# Patient Record
Sex: Female | Born: 1970 | Race: White | Hispanic: No | Marital: Married | Smoking: Current every day smoker
Health system: Southern US, Community
[De-identification: ages and names within clinical notes are randomized; demographics above are authoritative.]

## PROBLEM LIST (undated history)

## (undated) DIAGNOSIS — M797 Fibromyalgia: Secondary | ICD-10-CM

## (undated) DIAGNOSIS — C801 Malignant (primary) neoplasm, unspecified: Secondary | ICD-10-CM

## (undated) DIAGNOSIS — M1990 Unspecified osteoarthritis, unspecified site: Secondary | ICD-10-CM

## (undated) DIAGNOSIS — E119 Type 2 diabetes mellitus without complications: Secondary | ICD-10-CM

## (undated) HISTORY — PX: CHOLECYSTECTOMY: SHX55

## (undated) HISTORY — PX: ABDOMINAL HYSTERECTOMY: SHX81

## (undated) HISTORY — PX: CYSTOSCOPY W/ URETERAL STENT PLACEMENT: SHX1429

## (undated) HISTORY — PX: TUBAL LIGATION: SHX77

---

## 2004-06-07 ENCOUNTER — Emergency Department: Payer: Self-pay | Admitting: Emergency Medicine

## 2004-07-08 ENCOUNTER — Emergency Department: Payer: Self-pay | Admitting: Emergency Medicine

## 2004-08-11 ENCOUNTER — Emergency Department: Payer: Self-pay | Admitting: Emergency Medicine

## 2004-09-17 ENCOUNTER — Inpatient Hospital Stay: Payer: Self-pay | Admitting: Internal Medicine

## 2004-09-29 ENCOUNTER — Ambulatory Visit: Payer: Self-pay | Admitting: *Deleted

## 2006-09-16 ENCOUNTER — Emergency Department: Payer: Self-pay | Admitting: Emergency Medicine

## 2007-04-01 ENCOUNTER — Emergency Department: Payer: Self-pay

## 2007-07-26 ENCOUNTER — Emergency Department: Payer: Self-pay | Admitting: Emergency Medicine

## 2007-07-27 ENCOUNTER — Emergency Department: Payer: Self-pay | Admitting: Emergency Medicine

## 2007-07-31 ENCOUNTER — Emergency Department: Payer: Self-pay | Admitting: Emergency Medicine

## 2007-09-07 ENCOUNTER — Emergency Department: Payer: Self-pay | Admitting: Emergency Medicine

## 2007-11-17 ENCOUNTER — Emergency Department: Payer: Self-pay | Admitting: Emergency Medicine

## 2008-04-27 ENCOUNTER — Emergency Department: Payer: Self-pay | Admitting: Emergency Medicine

## 2010-06-11 ENCOUNTER — Emergency Department: Payer: Self-pay | Admitting: Emergency Medicine

## 2010-08-28 ENCOUNTER — Emergency Department: Payer: Self-pay | Admitting: Emergency Medicine

## 2011-02-03 ENCOUNTER — Emergency Department: Payer: Self-pay | Admitting: Emergency Medicine

## 2011-07-10 ENCOUNTER — Inpatient Hospital Stay: Payer: Self-pay | Admitting: Internal Medicine

## 2011-07-10 ENCOUNTER — Ambulatory Visit: Payer: Self-pay | Admitting: Urology

## 2011-07-16 ENCOUNTER — Inpatient Hospital Stay: Payer: Self-pay | Admitting: Student

## 2011-07-21 ENCOUNTER — Ambulatory Visit: Payer: Self-pay | Admitting: Urology

## 2014-12-29 NOTE — Op Note (Signed)
PATIENT NAME:  Heather, Duran MR#:  [id number] DATE OF BIRTH:  01-04-1971  DATE OF PROCEDURE:  07/10/2011  PREOPERATIVE DIAGNOSIS:  Right distal ureteral stone.  POSTOPERATIVE DIAGNOSIS:  Right distal ureteral stone.  PROCEDURE:  Cystoscopy, right retrograde pyelogram, right ureteral stent placement, 6 x 24 double J.  SURGEON:  Dr. Verdie Shire  ANESTHESIA:  General.  ESTIMATED BLOOD LOSS:  None.  FLUIDS:  Please see anesthesia record.  DRAINS:  6 x 24 double J ureteral stent.  COMPLICATIONS:  None.  CONDITION:  Awake and stable to PAC-U.  INDICATION FOR PROCEDURE:  Ms. [name] is a 44 year old female who presented to the emergency room with excruciating right flank pain radiating to her lower groin.  She also complained of nausea, vomiting, and inability to keep any food down.  The patient is also an uncontrolled diabetic and presented with sugars over 400 in the emergency room.  She had a CT scan done that showed right distal ureteral stone.  After discussing the risks, benefits, complications, and all possible outcomes of the procedure, the patient wished to proceed to the operating room for above procedure.  DESCRIPTION OF THE PROCEDURE:  After signing informed consent, the patient was brought back to the operating room  and placed in the supine position.  SCDs were placed to bilateral lower extremities.  Preoperative antibiotics were given.  Anesthesia was induced and the patient was intubated without any difficulty.  She was then placed in the lithotomy position and prepped and draped in sterile fashion.  Time-out was then performed.  A 21 French cystoscope was advanced to the bladder to view the area.  The entire bladder was visualized.  There were no abnormalities seen within the bladder.  The right ureteral orifice was identified and cannulated with a sensor guidewire.  The guidewire was able to be advanced all the way up into the renal pelvis.  This was confirmed using fluoroscopy.   An open-ended ureteral stent was then advanced over the sensor guidewire and the sensor guidewire was removed.  Using Cystografin, a right retrograde pyelogram was performed that showed a dilated right collecting system.   There was evidence of a distal ureteral stone.  Once the retrograde was performed, the sensor guidewire was then replaced into the renal pelvis.  The open-ended ureteral stent was removed and a double J 6 x 24 ureteral stent was placed over the sensor guidewire all the way up into the renal pelvis.  The sensor guidewire was removed and a coil was noted both in the renal pelvis and within the bladder.  The scope was then removed and a 16 French Foley catheter was placed to straight drain.  The patient was then awakened from anesthesia without any difficulty and transferred to the PAC-U in stable condition.  ____________________________ Margy Clarks, MD erm:bjt D: 07/23/2011 09:57:58 ET T: 07/23/2011 10:14:16 ET JOB#: [id number]  cc: Margy Clarks, MD, <Dictator> Lodema Hong [facility] MD ELECTRONICALLY SIGNED 10/27/2011 16:49

## 2018-07-13 ENCOUNTER — Emergency Department: Payer: Self-pay

## 2018-07-13 ENCOUNTER — Encounter: Payer: Self-pay | Admitting: Emergency Medicine

## 2018-07-13 ENCOUNTER — Emergency Department
Admission: EM | Admit: 2018-07-13 | Discharge: 2018-07-13 | Disposition: A | Discharge status: Home / Self Care | Payer: Self-pay | Attending: Emergency Medicine | Admitting: Emergency Medicine

## 2018-07-13 DIAGNOSIS — Y998 Other external cause status: Secondary | ICD-10-CM | POA: Insufficient documentation

## 2018-07-13 DIAGNOSIS — S46012A Strain of muscle(s) and tendon(s) of the rotator cuff of left shoulder, initial encounter: Secondary | ICD-10-CM | POA: Insufficient documentation

## 2018-07-13 DIAGNOSIS — Y92003 Bedroom of unspecified non-institutional (private) residence as the place of occurrence of the external cause: Secondary | ICD-10-CM | POA: Insufficient documentation

## 2018-07-13 DIAGNOSIS — Y93F2 Activity, caregiving, lifting: Secondary | ICD-10-CM | POA: Insufficient documentation

## 2018-07-13 DIAGNOSIS — X500XXA Overexertion from strenuous movement or load, initial encounter: Secondary | ICD-10-CM | POA: Insufficient documentation

## 2018-07-13 MED ORDER — MELOXICAM 15 MG PO TABS: 15.0000 mg | ORAL_TABLET | Freq: Every day | ORAL | 1 refills | Status: AC

## 2018-07-13 MED ORDER — TRAMADOL HCL 50 MG PO TABS: 50.0000 mg | ORAL_TABLET | Freq: Four times a day (QID) | ORAL | 0 refills | Status: AC | PRN

## 2018-07-13 NOTE — ED Triage Notes (Signed)
Pt reports that she was lifting her dad last night and felt her left shoulder pop. Pt reports pain with lifting arm.

## 2018-07-13 NOTE — ED Provider Notes (Signed)
[facility] Emergency Department Provider Note  ____________________________________________  Time seen: Approximately 4:48 PM  I have reviewed the triage vital signs and the nursing notes.   HISTORY  Chief Complaint Shoulder Pain    HPI Heather Duran is a 47 y.o. female presents to the emergency department with left shoulder pain.  Patient reports that her father recently had a stroke and she has been lifting him in and out of the bed.  Patient reports that she felt a pop and exquisite pain along the left lateral shoulder.  Patient reports that pain radiates underneath her left arm.  Patient reports that she is having difficulty brushing her hair and unclasping her bra.  Patient has never had a rotator cuff injury in the past.  No prior left shoulder issues.  Patient has been taking extra strength Tylenol but reports that medication is not helping her symptoms.  She denies chest pain or chest tightness.    History reviewed. No pertinent past medical history.  There are no active problems to display for this patient.    Prior to Admission medications   Medication Sig Start Date End Date Taking? Authorizing Provider  meloxicam (MOBIC) 15 MG tablet Take 1 tablet (15 mg total) by mouth daily for 7 days. 07/13/18 07/20/18  Lannie Fields, PA-C  traMADol (ULTRAM) 50 MG tablet Take 1 tablet (50 mg total) by mouth every 6 (six) hours as needed for up to 3 days. 07/13/18 07/16/18  Lannie Fields, PA-C    Allergies Penicillins and Sulfa antibiotics  No family history on file.  Social History Social History   Tobacco Use  . Smoking status: Not on file  Substance Use Topics  . Alcohol use: Not on file  . Drug use: Not on file     Review of Systems  Constitutional: No fever/chills Eyes: No visual changes. No discharge ENT: No upper respiratory complaints. Cardiovascular: no chest pain. Respiratory: no cough. No SOB. Gastrointestinal: No abdominal pain.   No nausea, no vomiting.  No diarrhea.  No constipation. Genitourinary: Negative for dysuria. No hematuria Musculoskeletal: Patient has left shoulder pain.  Skin: Negative for rash, abrasions, lacerations, ecchymosis. Neurological: Negative for headaches, focal weakness or numbness.   ____________________________________________   PHYSICAL EXAM:  VITAL SIGNS: ED Triage Vitals  Enc Vitals Group     BP 07/13/18 1525 138/63     Pulse Rate 07/13/18 1525 78     Resp 07/13/18 1525 17     Temp 07/13/18 1525 98.4 F (36.9 C)     Temp Source 07/13/18 1525 Oral     SpO2 07/13/18 1525 97 %     Weight 07/13/18 1521 144 lb (65.3 kg)     Height 07/13/18 1521 5\' 4"  (1.626 m)     Head Circumference --      Peak Flow --      Pain Score 07/13/18 1521 6     Pain Loc --      Pain Edu? --      Excl. in [place]? --      Constitutional: Alert and oriented. Well appearing and in no acute distress. Eyes: Conjunctivae are normal. PERRL. EOMI. Head: Atraumatic. Cardiovascular: Normal rate, regular rhythm. Normal S1 and S2.  Good peripheral circulation. Respiratory: Normal respiratory effort without tachypnea or retractions. Lungs CTAB. Good air entry to the bases with no decreased or absent breath sounds. Musculoskeletal: Patient has left-sided rotator cuff weakness with left lateral shoulder tenderness to palpation.  Patient  is able to perform abduction at left shoulder to approximately 45 degrees with some effort.  Palpable radial pulse, left. Neurologic:  Normal speech and language. No gross focal neurologic deficits are appreciated.  Skin:  Skin is warm, dry and intact. No rash noted. Psychiatric: Mood and affect are normal. Speech and behavior are normal. Patient exhibits appropriate insight and judgement.   ____________________________________________   LABS (all labs ordered are listed, but only abnormal results are displayed)  Labs Reviewed - No data to  display ____________________________________________  EKG   ____________________________________________  RADIOLOGY I personally viewed and evaluated these images as part of my medical decision making, as well as reviewing the written report by the radiologist.  Dg Shoulder Left  Result Date: 07/13/2018 CLINICAL DATA:  LEFT shoulder popped while trying to help per father, who has had a stroke, LEFT shoulder pain since EXAM: LEFT SHOULDER - 2+ VIEW COMPARISON:  None FINDINGS: Question mild osseous demineralization. AC joint alignment normal. No acute fracture, dislocation, or bone destruction. Visualized LEFT ribs unremarkable. IMPRESSION: No acute abnormalities. Electronically Signed   By: Lavonia Dana M.D.   On: 07/13/2018 15:46    ____________________________________________    PROCEDURES  Procedure(s) performed:    Procedures    Medications - No data to display   ____________________________________________   INITIAL IMPRESSION / ASSESSMENT AND PLAN / ED COURSE  Pertinent labs & imaging results that were available during my care of the patient were reviewed by me and considered in my medical decision making (see chart for details).  Review of the [place] CSRS was performed in accordance of the [place] prior to dispensing any controlled drugs.      Assessment and plan Left shoulder pain Patient presents to the emergency department with left-sided shoulder pain for the past 24 hours after lifting her father.  X-ray examination of the left shoulder revealed no acute abnormality.  Physical exam was concerning for left rotator cuff weakness.  Patient was placed in a sling and discharged with meloxicam.  She was referred to orthopedics, Dr. Rudene Christians.  Vital signs were reassuring prior to discharge.  All patient questions were answered.    ____________________________________________  FINAL CLINICAL IMPRESSION(S) / ED DIAGNOSES  Final diagnoses:  Traumatic tear of left rotator  cuff, unspecified tear extent, initial encounter      NEW MEDICATIONS STARTED DURING THIS VISIT:  ED Discharge Orders         Ordered    meloxicam (MOBIC) 15 MG tablet  Daily     07/13/18 1611    traMADol (ULTRAM) 50 MG tablet  Every 6 hours PRN     07/13/18 1611              This chart was dictated using voice recognition software/Dragon. Despite best efforts to proofread, errors can occur which can change the meaning. Any change was purely unintentional.    Lannie Fields, PA-C 07/13/18 1656    Arta Silence, MD 07/13/18 2149

## 2018-07-13 NOTE — ED Notes (Signed)
See triage note  States she felt a pop to left shoulder after lifting her father   No deformity noted   Good pulses and sensation

## 2018-09-24 ENCOUNTER — Emergency Department
Admission: EM | Admit: 2018-09-24 | Discharge: 2018-09-24 | Disposition: A | Discharge status: Home / Self Care | Payer: Self-pay | Attending: Emergency Medicine | Admitting: Emergency Medicine

## 2018-09-24 ENCOUNTER — Other Ambulatory Visit: Payer: Self-pay

## 2018-09-24 DIAGNOSIS — F17200 Nicotine dependence, unspecified, uncomplicated: Secondary | ICD-10-CM | POA: Insufficient documentation

## 2018-09-24 DIAGNOSIS — N390 Urinary tract infection, site not specified: Secondary | ICD-10-CM | POA: Insufficient documentation

## 2018-09-24 LAB — COMPREHENSIVE METABOLIC PANEL
ALBUMIN: 3.8 g/dL (ref 3.5–5.0)
ALT: 17 U/L (ref 0–44)
AST: 15 U/L (ref 15–41)
Alkaline Phosphatase: 75 U/L (ref 38–126)
Anion gap: 6 (ref 5–15)
BILIRUBIN TOTAL: 0.6 mg/dL (ref 0.3–1.2)
BUN: 12 mg/dL (ref 6–20)
CHLORIDE: 104 mmol/L (ref 98–111)
CO2: 26 mmol/L (ref 22–32)
Calcium: 8.9 mg/dL (ref 8.9–10.3)
Creatinine, Ser: 0.55 mg/dL (ref 0.44–1.00)
GFR calc Af Amer: >60 mL/min (ref >60)
GFR calc non Af Amer: >60 mL/min (ref >60)
GLUCOSE: 301 mg/dL — AB (ref 70–99)
POTASSIUM: 3.8 mmol/L (ref 3.5–5.1)
Sodium: 136 mmol/L (ref 135–145)
Total Protein: 7 g/dL (ref 6.5–8.1)

## 2018-09-24 LAB — URINALYSIS, COMPLETE (UACMP) WITH MICROSCOPIC
BACTERIA UA: NONE SEEN
Bilirubin Urine: NEGATIVE
Glucose, UA: >=500 mg/dL — AB
Hgb urine dipstick: NEGATIVE
Ketones, ur: NEGATIVE mg/dL
Nitrite: NEGATIVE
Protein, ur: 100 mg/dL — AB
SQUAMOUS EPITHELIAL / LPF: NONE SEEN (ref 0–5)
Specific Gravity, Urine: 1.007 (ref 1.005–1.030)
pH: 6 (ref 5.0–8.0)

## 2018-09-24 LAB — CBC
HEMATOCRIT: 41.5 % (ref 36.0–46.0)
Hemoglobin: 13.9 g/dL (ref 12.0–15.0)
MCH: 29 pg (ref 26.0–34.0)
MCHC: 33.5 g/dL (ref 30.0–36.0)
MCV: 86.6 fL (ref 80.0–100.0)
Platelets: 259 10*3/uL (ref 150–400)
RBC: 4.79 MIL/uL (ref 3.87–5.11)
RDW: 13 % (ref 11.5–15.5)
WBC: 12.6 10*3/uL — ABNORMAL HIGH (ref 4.0–10.5)
nRBC: 0 % (ref 0.0–0.2)

## 2018-09-24 LAB — LIPASE, BLOOD: Lipase: 24 U/L (ref 11–51)

## 2018-09-24 MED ORDER — PHENAZOPYRIDINE HCL 200 MG PO TABS: 200.0000 mg | ORAL_TABLET | Freq: Three times a day (TID) | ORAL | 0 refills | Status: DC | PRN

## 2018-09-24 MED ORDER — CIPROFLOXACIN IN D5W 400 MG/200ML IV SOLN
400.0000 mg | Freq: Once | INTRAVENOUS | Status: AC
  Administered 2018-09-24: 400 mg via INTRAVENOUS
  Filled 2018-09-24: qty 200

## 2018-09-24 MED ORDER — KETOROLAC TROMETHAMINE 30 MG/ML IJ SOLN
30.0000 mg | Freq: Once | INTRAMUSCULAR | Status: AC
  Administered 2018-09-24: 30 mg via INTRAVENOUS
  Filled 2018-09-24: qty 1

## 2018-09-24 MED ORDER — SODIUM CHLORIDE 0.9 % IV BOLUS
1000.0000 mL | Freq: Once | INTRAVENOUS | Status: AC
  Administered 2018-09-24: 1000 mL via INTRAVENOUS

## 2018-09-24 MED ORDER — SODIUM CHLORIDE 0.9% FLUSH
3.0000 mL | Freq: Once | INTRAVENOUS | Status: AC
  Administered 2018-09-24: 3 mL via INTRAVENOUS

## 2018-09-24 MED ORDER — CIPROFLOXACIN HCL 500 MG PO TABS: 500.0000 mg | ORAL_TABLET | Freq: Two times a day (BID) | ORAL | 0 refills | Status: AC

## 2018-09-24 MED ORDER — PHENAZOPYRIDINE HCL 200 MG PO TABS
200.0000 mg | ORAL_TABLET | Freq: Once | ORAL | Status: AC
  Administered 2018-09-24: 200 mg via ORAL
  Filled 2018-09-24: qty 1

## 2018-09-24 NOTE — ED Provider Notes (Signed)
[facility] Emergency Department Provider Note   ____________________________________________   I have reviewed the triage vital signs and the nursing notes.   HISTORY  Chief Complaint Pelvic Pain   History limited by: Not Limited   HPI Heather Duran is a 48 y.o. female who presents to the emergency department today because of concern for left lower quadrant abdominal pain. The pain started yesterday. She was not doing any abnormal activity when it started. Since it began it has been constant with some increase and decrease in severity. She describes the pain as sharp. It is accompanied by nausea but she has not had any vomiting. She has not noticed any change in defecation. States her urine has been clear. The patient has had chills but no fevers. Has had similar pain in the past with kidney stones.    Per medical record review patient has a history of hysterectomy  History reviewed. No pertinent past medical history.  There are no active problems to display for this patient.   Past Surgical History:  Procedure Laterality Date  . ABDOMINAL HYSTERECTOMY    . CESAREAN SECTION    . CHOLECYSTECTOMY    . CYSTOSCOPY W/ URETERAL STENT PLACEMENT    . TUBAL LIGATION      Prior to Admission medications   Not on File    Allergies Penicillins and Sulfa antibiotics  History reviewed. No pertinent family history.  Social History Social History   Tobacco Use  . Smoking status: Current Every Day Smoker  Substance Use Topics  . Alcohol use: Not Currently  . Drug use: Not on file    Review of Systems Constitutional: No fever/chills Eyes: No visual changes. ENT: No sore throat. Cardiovascular: Denies chest pain. Respiratory: Denies shortness of breath. Gastrointestinal: Positive for left lower quadrant pain.  Genitourinary: Negative for dysuria. Musculoskeletal: Negative for back pain. Skin: Negative for rash. Neurological: Negative for  headaches, focal weakness or numbness.  ____________________________________________   PHYSICAL EXAM:  VITAL SIGNS: ED Triage Vitals  Enc Vitals Group     BP 09/24/18 1034 132/65     Pulse Rate 09/24/18 1034 77     Resp 09/24/18 1034 16     Temp 09/24/18 1034 97.9 F (36.6 C)     Temp Source 09/24/18 1034 Oral     SpO2 09/24/18 1034 100 %     Weight 09/24/18 1035 139 lb (63 kg)     Height 09/24/18 1035 5\' 4"  (1.626 m)     Head Circumference --      Peak Flow --      Pain Score 09/24/18 1035 7   Constitutional: Alert and oriented.  Eyes: Conjunctivae are normal.  ENT      Head: Normocephalic and atraumatic.      Nose: No congestion/rhinnorhea.      Mouth/Throat: Mucous membranes are moist.      Neck: No stridor. Hematological/Lymphatic/Immunilogical: No cervical lymphadenopathy. Cardiovascular: Normal rate, regular rhythm.  No murmurs, rubs, or gallops.  Respiratory: Normal respiratory effort without tachypnea nor retractions. Breath sounds are clear and equal bilaterally. No wheezes/rales/rhonchi. Gastrointestinal: Soft and tender to palpation in the left side of the abdomen.  Genitourinary: Deferred Musculoskeletal: Normal range of motion in all extremities. No lower extremity edema. Neurologic:  Normal speech and language. No gross focal neurologic deficits are appreciated.  Skin:  Skin is warm, dry and intact. No rash noted. Psychiatric: Mood and affect are normal. Speech and behavior are normal. Patient exhibits appropriate insight  and judgment.  ____________________________________________    LABS (pertinent positives/negatives)  Lipase 24 CMP wnl except glu 301 UA clear, > 500 glucose, protein 100, trace leukocytes, 21-50 wbc CBC wbc 12.6, hgb 13.9, plt 259 ____________________________________________   EKG  None  ____________________________________________     RADIOLOGY  None  ____________________________________________   PROCEDURES  Procedures  ____________________________________________   INITIAL IMPRESSION / ASSESSMENT AND PLAN / ED COURSE  Pertinent labs & imaging results that were available during my care of the patient were reviewed by me and considered in my medical decision making (see chart for details).   Patient presented to the emergency department today because of concerns for left lower quadrant abdominal pain.  On exam she did have some tenderness left lower quadrant and in the left CVA.  Urine was concerning for possible urinary tract infection with white blood cells and leukocytosis.  No red blood cells to suggest kidney stone.  Will plan on giving patient dose of IV antibiotics.  Will plan on discharge with further antibiotics.  Discussed findings and plan with patient.  ___________________________________________   FINAL CLINICAL IMPRESSION(S) / ED DIAGNOSES  Final diagnoses:  Lower urinary tract infectious disease     Note: This dictation was prepared with Dragon dictation. Any transcriptional errors that result from this process are unintentional     Nance Pear, MD 09/24/18 [phone]

## 2018-09-24 NOTE — ED Notes (Signed)
Gave pt urine cup waiting on sample.

## 2018-09-24 NOTE — ED Notes (Signed)
Sent rainbow to lab. 

## 2018-09-24 NOTE — Discharge Instructions (Addendum)
Please seek medical attention for any high fevers, chest pain, shortness of breath, change in behavior, persistent vomiting, bloody stool or any other new or concerning symptoms.  

## 2018-09-24 NOTE — ED Triage Notes (Signed)
Pt states L pelvic pain and nausea. Symptoms began yesterday. No ovaries. Denies urinary symptoms. A&O.

## 2018-09-25 LAB — URINE CULTURE: Culture: <10000 — AB

## 2018-10-23 ENCOUNTER — Encounter: Payer: Self-pay | Admitting: Emergency Medicine

## 2018-10-23 ENCOUNTER — Emergency Department: Payer: Self-pay

## 2018-10-23 ENCOUNTER — Emergency Department
Admission: EM | Admit: 2018-10-23 | Discharge: 2018-10-23 | Disposition: A | Discharge status: Home / Self Care | Payer: Self-pay | Attending: Emergency Medicine | Admitting: Emergency Medicine

## 2018-10-23 ENCOUNTER — Other Ambulatory Visit: Payer: Self-pay

## 2018-10-23 DIAGNOSIS — F17200 Nicotine dependence, unspecified, uncomplicated: Secondary | ICD-10-CM | POA: Insufficient documentation

## 2018-10-23 DIAGNOSIS — R739 Hyperglycemia, unspecified: Secondary | ICD-10-CM

## 2018-10-23 DIAGNOSIS — E1165 Type 2 diabetes mellitus with hyperglycemia: Secondary | ICD-10-CM | POA: Insufficient documentation

## 2018-10-23 DIAGNOSIS — B3741 Candidal cystitis and urethritis: Secondary | ICD-10-CM | POA: Insufficient documentation

## 2018-10-23 LAB — CBC WITH DIFFERENTIAL/PLATELET
ABS IMMATURE GRANULOCYTES: 0.04 10*3/uL (ref 0.00–0.07)
Basophils Absolute: 0.1 10*3/uL (ref 0.0–0.1)
Basophils Relative: 1 %
EOS ABS: 0.3 10*3/uL (ref 0.0–0.5)
Eosinophils Relative: 2 %
HEMATOCRIT: 41.2 % (ref 36.0–46.0)
HEMOGLOBIN: 13.6 g/dL (ref 12.0–15.0)
Immature Granulocytes: 0 %
Lymphocytes Relative: 23 %
Lymphs Abs: 3.1 10*3/uL (ref 0.7–4.0)
MCH: 28.5 pg (ref 26.0–34.0)
MCHC: 33 g/dL (ref 30.0–36.0)
MCV: 86.4 fL (ref 80.0–100.0)
MONO ABS: 0.6 10*3/uL (ref 0.1–1.0)
Monocytes Relative: 5 %
NRBC: 0 % (ref 0.0–0.2)
Neutro Abs: 9.4 10*3/uL — ABNORMAL HIGH (ref 1.7–7.7)
Neutrophils Relative %: 69 %
Platelets: 266 10*3/uL (ref 150–400)
RBC: 4.77 MIL/uL (ref 3.87–5.11)
RDW: 12.9 % (ref 11.5–15.5)
WBC: 13.6 10*3/uL — AB (ref 4.0–10.5)

## 2018-10-23 LAB — COMPREHENSIVE METABOLIC PANEL
ALT: 15 U/L (ref 0–44)
AST: 15 U/L (ref 15–41)
Albumin: 3.4 g/dL — ABNORMAL LOW (ref 3.5–5.0)
Alkaline Phosphatase: 76 U/L (ref 38–126)
Anion gap: 8 (ref 5–15)
BILIRUBIN TOTAL: 0.4 mg/dL (ref 0.3–1.2)
BUN: 15 mg/dL (ref 6–20)
CALCIUM: 8.9 mg/dL (ref 8.9–10.3)
CO2: 24 mmol/L (ref 22–32)
CREATININE: 0.58 mg/dL (ref 0.44–1.00)
Chloride: 102 mmol/L (ref 98–111)
GFR calc Af Amer: >60 mL/min (ref >60)
GLUCOSE: 395 mg/dL — AB (ref 70–99)
Potassium: 3.8 mmol/L (ref 3.5–5.1)
Sodium: 134 mmol/L — ABNORMAL LOW (ref 135–145)
TOTAL PROTEIN: 6.9 g/dL (ref 6.5–8.1)

## 2018-10-23 LAB — URINALYSIS, COMPLETE (UACMP) WITH MICROSCOPIC
BILIRUBIN URINE: NEGATIVE
Hgb urine dipstick: NEGATIVE
KETONES UR: NEGATIVE mg/dL
LEUKOCYTE UA: NEGATIVE
Nitrite: NEGATIVE
PH: 6 (ref 5.0–8.0)
Protein, ur: 30 mg/dL — AB
SPECIFIC GRAVITY, URINE: 1.032 — AB (ref 1.005–1.030)

## 2018-10-23 MED ORDER — SODIUM CHLORIDE 0.9 % IV BOLUS
1000.0000 mL | Freq: Once | INTRAVENOUS | Status: AC
  Administered 2018-10-23: 1000 mL via INTRAVENOUS

## 2018-10-23 MED ORDER — IOHEXOL 300 MG/ML  SOLN
100.0000 mL | Freq: Once | INTRAMUSCULAR | Status: AC | PRN
  Administered 2018-10-23: 100 mL via INTRAVENOUS
  Filled 2018-10-23: qty 100

## 2018-10-23 MED ORDER — ONDANSETRON HCL 4 MG/2ML IJ SOLN
4.0000 mg | Freq: Once | INTRAMUSCULAR | Status: AC
  Administered 2018-10-23: 4 mg via INTRAVENOUS
  Filled 2018-10-23: qty 2

## 2018-10-23 MED ORDER — DIPHENOXYLATE-ATROPINE 2.5-0.025 MG PO TABS: 2.0000 | ORAL_TABLET | Freq: Four times a day (QID) | ORAL | 0 refills | Status: DC | PRN

## 2018-10-23 MED ORDER — PIOGLITAZONE HCL 15 MG PO TABS: 15.0000 mg | ORAL_TABLET | Freq: Every day | ORAL | 0 refills | Status: AC

## 2018-10-23 MED ORDER — CLINDAMYCIN HCL 150 MG PO CAPS: 300.0000 mg | ORAL_CAPSULE | Freq: Three times a day (TID) | ORAL | 0 refills | Status: DC

## 2018-10-23 MED ORDER — FLUCONAZOLE 50 MG PO TABS
150.0000 mg | ORAL_TABLET | Freq: Once | ORAL | Status: AC
  Administered 2018-10-23: 150 mg via ORAL
  Filled 2018-10-23: qty 1

## 2018-10-23 NOTE — ED Triage Notes (Signed)
N/V/D since yesterday ,  Boil to panty line region x6 days , started oozing yesterday ( hx of simular boils to same region)

## 2018-10-23 NOTE — ED Provider Notes (Signed)
[facility] Emergency Department Provider Note  ___________________________________________   First MD Initiated Contact with Patient 10/23/18 1056     (approximate)  I have reviewed the triage vital signs and the nursing notes.   HISTORY  Chief Complaint Emesis   HPI Heather Duran is a 48 y.o. female who presents to the emergency department for treatment and evaluation of nausea and diarrhea that started yesterday.  She has also had an abscess to the left groin for the past 6 or 7 days but has started draining.  She has had these previously. She has had no relief of diarrhea with Imodium.   History reviewed. No pertinent past medical history.  There are no active problems to display for this patient.   Past Surgical History:  Procedure Laterality Date  . ABDOMINAL HYSTERECTOMY    . CESAREAN SECTION    . CHOLECYSTECTOMY    . CYSTOSCOPY W/ URETERAL STENT PLACEMENT    . TUBAL LIGATION      Prior to Admission medications   Medication Sig Start Date End Date Taking? Authorizing Provider  clindamycin (CLEOCIN) 150 MG capsule Take 2 capsules (300 mg total) by mouth 3 (three) times daily. 10/23/18   Licia Harl B, FNP  diphenoxylate-atropine (LOMOTIL) 2.5-0.025 MG tablet Take 2 tablets by mouth 4 (four) times daily as needed for diarrhea or loose stools. 10/23/18   Ilyanna Baillargeon, Johnette Abraham B, FNP  phenazopyridine (PYRIDIUM) 200 MG tablet Take 1 tablet (200 mg total) by mouth 3 (three) times daily as needed for pain. 09/24/18 09/24/19  Nance Pear, MD  pioglitazone (ACTOS) 15 MG tablet Take 1 tablet (15 mg total) by mouth daily. 10/23/18   Renny Remer, Johnette Abraham B, FNP    Allergies Penicillins and Sulfa antibiotics  No family history on file.  Social History Social History   Tobacco Use  . Smoking status: Current Every Day Smoker  . Smokeless tobacco: Never Used  Substance Use Topics  . Alcohol use: Not Currently  . Drug use: Not on file    Review of  Systems  Constitutional: No fever/chills. Eyes: No visual changes. ENT: No sore throat. Cardiovascular: Denies chest pain. Respiratory: Denies shortness of breath. Gastrointestinal: No abdominal pain.  Positive for nausea, no vomiting.  4-5 episodes of  diarrhea.  No constipation. Genitourinary: Negative for dysuria. Musculoskeletal: Negative for back pain.  Skin: Negative for rash. Neurological: Negative for headaches, focal weakness or numbness. ____________________________________________   PHYSICAL EXAM:  VITAL SIGNS: ED Triage Vitals  Enc Vitals Group     BP 10/23/18 1008 131/63     Pulse Rate 10/23/18 1008 78     Resp 10/23/18 1008 18     Temp 10/23/18 1008 98.4 F (36.9 C)     Temp Source 10/23/18 1008 Oral     SpO2 10/23/18 1008 98 %     Weight 10/23/18 1009 140 lb (63.5 kg)     Height 10/23/18 1009 5\' 4"  (1.626 m)     Head Circumference --      Peak Flow --      Pain Score 10/23/18 1009 3     Pain Loc --      Pain Edu? --      Excl. in [place]? --     Constitutional: Alert and oriented. Well appearing and in no acute distress. Eyes: Conjunctivae are normal. PERRL. EOMI. Head: Atraumatic. Nose: No congestion/rhinnorhea. Mouth/Throat: Mucous membranes are moist.  Oropharynx non-erythematous. Neck: No stridor.   Cardiovascular: Normal rate, regular rhythm.  Grossly normal heart sounds.  Good peripheral circulation. Respiratory: Normal respiratory effort.  No retractions. Lungs CTAB. Gastrointestinal: Soft, right lower quadrant tenderness on exam. No distention. No abdominal bruits. No CVA tenderness. Musculoskeletal: No lower extremity tenderness nor edema.  No joint effusions. Neurologic:  Normal speech and language. No gross focal neurologic deficits are appreciated. No gait instability. Skin:  Skin is warm, dry and intact. No rash noted. Psychiatric: Mood and affect are normal. Speech and behavior are normal.  ____________________________________________    LABS (all labs ordered are listed, but only abnormal results are displayed)  Labs Reviewed  CBC WITH DIFFERENTIAL/PLATELET - Abnormal; Notable for the following components:      Result Value   WBC 13.6 (*)    Neutro Abs 9.4 (*)    All other components within normal limits  COMPREHENSIVE METABOLIC PANEL - Abnormal; Notable for the following components:   Sodium 134 (*)    Glucose, Bld 395 (*)    Albumin 3.4 (*)    All other components within normal limits  URINALYSIS, COMPLETE (UACMP) WITH MICROSCOPIC - Abnormal; Notable for the following components:   Color, Urine YELLOW (*)    APPearance CLEAR (*)    Specific Gravity, Urine 1.032 (*)    Glucose, UA >=500 (*)    Protein, ur 30 (*)    Bacteria, UA RARE (*)    All other components within normal limits   ____________________________________________  EKG  Not indicated. ____________________________________________  RADIOLOGY  ED MD interpretation:  CT abdomen and pelvis is negative for acute findings.   Official radiology report(s): Ct Abdomen Pelvis W Contrast  Result Date: 10/23/2018 CLINICAL DATA:  Nausea, vomiting, diarrhea. EXAM: CT ABDOMEN AND PELVIS WITH CONTRAST TECHNIQUE: Multidetector CT imaging of the abdomen and pelvis was performed using the standard protocol following bolus administration of intravenous contrast. CONTRAST:  180mL OMNIPAQUE IOHEXOL 300 MG/ML  SOLN COMPARISON:  CT scan of July 15, 2011. FINDINGS: Lower chest: No acute abnormality. Hepatobiliary: No focal liver abnormality is seen. Status post cholecystectomy. No biliary dilatation. Pancreas: Unremarkable. No pancreatic ductal dilatation or surrounding inflammatory changes. Spleen: Normal in size without focal abnormality. Adrenals/Urinary Tract: Stable left adrenal adenoma. Right adrenal gland appears normal. Left renal cyst is noted. No hydronephrosis or renal obstruction is noted. Urinary bladder is unremarkable. No renal or ureteral calculi are  noted. Stomach/Bowel: Stomach is within normal limits. Appendix appears normal. No evidence of bowel wall thickening, distention, or inflammatory changes. Vascular/Lymphatic: Aortic atherosclerosis. No enlarged abdominal or pelvic lymph nodes. Reproductive: Status post hysterectomy. No adnexal masses. Other: No abdominal wall hernia or abnormality. No abdominopelvic ascites. Musculoskeletal: No acute or significant osseous findings. IMPRESSION: Stable left adrenal adenoma. No acute abnormality seen in the abdomen or pelvis. Aortic Atherosclerosis (ICD10-I70.0). Electronically Signed   By: Marijo Conception, M.D.   On: 10/23/2018 12:24    ____________________________________________   PROCEDURES  Procedure(s) performed: None  Procedures  Critical Care performed: No  ____________________________________________   INITIAL IMPRESSION / ASSESSMENT AND PLAN / ED COURSE  As part of my medical decision making, I reviewed the following data within the electronic MEDICAL RECORD NUMBER Notes from prior ED visits  The 48 year old female presenting to the emergency department for treatment and evaluation of nausea and diarrhea for the past 24 hours or so.  She also has an abscess that is draining in the left groin.  She is a type II diabetic but has not been on any of her medications for the past 10  years.  Patient states that she lost a significant amount of weight and her blood sugars were pretty well controlled so she was taken off of all of her medications.  She states that she checks her blood sugar and it typically runs between 190 and 220.  She states that if it drops below 150 that she feels bad.  We discussed her labs today and she was advised that she does need to be on medication for her diabetes.  She is reluctant to start medications again, but agrees to "try."  She states that she does not tolerate metformin.  Glipizide was also a second option, however she has a significant allergic reaction to  sulfa.  Therefore, she will be prescribed Actos and encouraged to check her blood sugar a couple times a day.  She was also advised to stay on a diabetic diet.   We also discussed the results of her CT which is negative for acute findings. The cause of her RLQ tenderness is unclear. She was advised to establish a PCP as soon as possible and schedule a follow up appointment. She reports that she won't have any insurance for the next 90 days. She was advised to return to the ER for symptoms that change, worsen, or for new concerns. She was also provided a list of community resources for lower cost health care providers and encouraged to apply for assistance.      ____________________________________________   FINAL CLINICAL IMPRESSION(S) / ED DIAGNOSES  Final diagnoses:  Hyperglycemia  Candida cystitis     ED Discharge Orders         Ordered    clindamycin (CLEOCIN) 150 MG capsule  3 times daily     10/23/18 1310    diphenoxylate-atropine (LOMOTIL) 2.5-0.025 MG tablet  4 times daily PRN     10/23/18 1310    pioglitazone (ACTOS) 15 MG tablet  Daily     10/23/18 1310           Note:  This document was prepared using Dragon voice recognition software and may include unintentional dictation errors.    Victorino Dike, FNP 10/24/18 [place], [place], MD 10/24/18 1446

## 2018-10-23 NOTE — ED Notes (Signed)
See triage note  States she developed a small possible abscess area to left groin area about 3 days ago  Also has been nauseated and had some diarrhea

## 2018-11-22 ENCOUNTER — Emergency Department
Admission: EM | Admit: 2018-11-22 | Discharge: 2018-11-22 | Disposition: A | Discharge status: Home / Self Care | Payer: Self-pay | Attending: Emergency Medicine | Admitting: Emergency Medicine

## 2018-11-22 ENCOUNTER — Other Ambulatory Visit: Payer: Self-pay

## 2018-11-22 DIAGNOSIS — R739 Hyperglycemia, unspecified: Secondary | ICD-10-CM | POA: Insufficient documentation

## 2018-11-22 DIAGNOSIS — R110 Nausea: Secondary | ICD-10-CM | POA: Insufficient documentation

## 2018-11-22 DIAGNOSIS — R197 Diarrhea, unspecified: Secondary | ICD-10-CM | POA: Insufficient documentation

## 2018-11-22 DIAGNOSIS — F17200 Nicotine dependence, unspecified, uncomplicated: Secondary | ICD-10-CM | POA: Insufficient documentation

## 2018-11-22 DIAGNOSIS — Z79899 Other long term (current) drug therapy: Secondary | ICD-10-CM | POA: Insufficient documentation

## 2018-11-22 LAB — COMPREHENSIVE METABOLIC PANEL
ALT: 18 U/L (ref 0–44)
AST: 14 U/L — AB (ref 15–41)
Albumin: 3.8 g/dL (ref 3.5–5.0)
Alkaline Phosphatase: 77 U/L (ref 38–126)
Anion gap: 9 (ref 5–15)
BUN: 18 mg/dL (ref 6–20)
CO2: 25 mmol/L (ref 22–32)
CREATININE: 0.58 mg/dL (ref 0.44–1.00)
Calcium: 8.8 mg/dL — ABNORMAL LOW (ref 8.9–10.3)
Chloride: 102 mmol/L (ref 98–111)
Glucose, Bld: 322 mg/dL — ABNORMAL HIGH (ref 70–99)
POTASSIUM: 4 mmol/L (ref 3.5–5.1)
Sodium: 136 mmol/L (ref 135–145)
TOTAL PROTEIN: 7.1 g/dL (ref 6.5–8.1)
Total Bilirubin: 0.2 mg/dL — ABNORMAL LOW (ref 0.3–1.2)

## 2018-11-22 LAB — CBC
HCT: 42 % (ref 36.0–46.0)
Hemoglobin: 13.7 g/dL (ref 12.0–15.0)
MCH: 28.7 pg (ref 26.0–34.0)
MCHC: 32.6 g/dL (ref 30.0–36.0)
MCV: 88.1 fL (ref 80.0–100.0)
Platelets: 277 10*3/uL (ref 150–400)
RBC: 4.77 MIL/uL (ref 3.87–5.11)
RDW: 13.1 % (ref 11.5–15.5)
WBC: 11.6 10*3/uL — ABNORMAL HIGH (ref 4.0–10.5)
nRBC: 0 % (ref 0.0–0.2)

## 2018-11-22 LAB — URINALYSIS, COMPLETE (UACMP) WITH MICROSCOPIC
Bacteria, UA: NONE SEEN
Bilirubin Urine: NEGATIVE
Glucose, UA: >=500 mg/dL — AB
Hgb urine dipstick: NEGATIVE
Ketones, ur: NEGATIVE mg/dL
Leukocytes,Ua: NEGATIVE
Nitrite: NEGATIVE
Protein, ur: 100 mg/dL — AB
Specific Gravity, Urine: 1.027 (ref 1.005–1.030)
pH: 6 (ref 5.0–8.0)

## 2018-11-22 LAB — POCT PREGNANCY, URINE: Preg Test, Ur: NEGATIVE

## 2018-11-22 LAB — LIPASE, BLOOD: LIPASE: 37 U/L (ref 11–51)

## 2018-11-22 LAB — GLUCOSE, CAPILLARY: GLUCOSE-CAPILLARY: 265 mg/dL — AB (ref 70–99)

## 2018-11-22 MED ORDER — ONDANSETRON HCL 4 MG/2ML IJ SOLN
4.0000 mg | Freq: Once | INTRAMUSCULAR | Status: AC
  Administered 2018-11-22: 4 mg via INTRAVENOUS
  Filled 2018-11-22: qty 2

## 2018-11-22 MED ORDER — SODIUM CHLORIDE 0.9 % IV BOLUS
1000.0000 mL | Freq: Once | INTRAVENOUS | Status: AC
  Administered 2018-11-22: 1000 mL via INTRAVENOUS

## 2018-11-22 MED ORDER — KETOROLAC TROMETHAMINE 30 MG/ML IJ SOLN
15.0000 mg | Freq: Once | INTRAMUSCULAR | Status: AC
  Administered 2018-11-22: 15 mg via INTRAVENOUS
  Filled 2018-11-22: qty 1

## 2018-11-22 MED ORDER — ONDANSETRON 4 MG PO TBDP: 4.0000 mg | ORAL_TABLET | Freq: Three times a day (TID) | ORAL | 0 refills | Status: DC | PRN

## 2018-11-22 NOTE — ED Notes (Signed)
ED Provider at bedside. 

## 2018-11-22 NOTE — ED Provider Notes (Signed)
[facility] Emergency Department Provider Note  ____________________________________________  Time seen: Approximately 11:15 AM  I have reviewed the triage vital signs and the nursing notes.   HISTORY  Chief Complaint Diarrhea   HPI Heather Duran is a 48 y.o. female with history as listed below who presents for evaluation of nausea and diarrhea.  Patient reports that her symptoms have been ongoing for 3 days.  She reports 4-6 daily episodes of watery diarrhea and constant moderate nausea.  No vomiting, no fever or chills, no cough or respiratory symptoms, no chest pain or shortness of breath, no abdominal pain.  She is still able to keep fluids down.  She denies any recent travel or any known sick contact exposures.  No recent antibiotic use or history of C. difficile  PMH None - reviewed  Past Surgical History:  Procedure Laterality Date  . ABDOMINAL HYSTERECTOMY    . CESAREAN SECTION    . CHOLECYSTECTOMY    . CYSTOSCOPY W/ URETERAL STENT PLACEMENT    . TUBAL LIGATION      Prior to Admission medications   Medication Sig Start Date End Date Taking? Authorizing Provider  clindamycin (CLEOCIN) 150 MG capsule Take 2 capsules (300 mg total) by mouth 3 (three) times daily. 10/23/18   Triplett, Cari B, FNP  diphenoxylate-atropine (LOMOTIL) 2.5-0.025 MG tablet Take 2 tablets by mouth 4 (four) times daily as needed for diarrhea or loose stools. 10/23/18   Triplett, Cari B, FNP  ondansetron (ZOFRAN ODT) 4 MG disintegrating tablet Take 1 tablet (4 mg total) by mouth every 8 (eight) hours as needed. 11/22/18   Rudene Re, MD  phenazopyridine (PYRIDIUM) 200 MG tablet Take 1 tablet (200 mg total) by mouth 3 (three) times daily as needed for pain. 09/24/18 09/24/19  Nance Pear, MD  pioglitazone (ACTOS) 15 MG tablet Take 1 tablet (15 mg total) by mouth daily. 10/23/18   Triplett, Johnette Abraham B, FNP    Allergies Penicillins and Sulfa antibiotics  No family history  on file.  Social History Social History   Tobacco Use  . Smoking status: Current Every Day Smoker  . Smokeless tobacco: Never Used  Substance Use Topics  . Alcohol use: Not Currently  . Drug use: Not on file    Review of Systems  Constitutional: Negative for fever. Eyes: Negative for visual changes. ENT: Negative for sore throat. Neck: No neck pain  Cardiovascular: Negative for chest pain. Respiratory: Negative for shortness of breath. Gastrointestinal: Negative for abdominal pain, vomiting. + nausea and diarrhea. Genitourinary: Negative for dysuria. Musculoskeletal: Negative for back pain. Skin: Negative for rash. Neurological: Negative for headaches, weakness or numbness. Psych: No SI or HI  ____________________________________________   PHYSICAL EXAM:  VITAL SIGNS: ED Triage Vitals [11/22/18 0932]  Enc Vitals Group     BP (!) 144/70     Pulse Rate 82     Resp 18     Temp 98.5 F (36.9 C)     Temp Source Oral     SpO2 99 %     Weight 143 lb (64.9 kg)     Height 5\' 4"  (1.626 m)     Head Circumference      Peak Flow      Pain Score 0     Pain Loc      Pain Edu?      Excl. in [place]?     Constitutional: Alert and oriented. Well appearing and in no apparent distress. HEENT:  Head: Normocephalic and atraumatic.         Eyes: Conjunctivae are normal. Sclera is non-icteric.       Mouth/Throat: Mucous membranes are moist.       Neck: Supple with no signs of meningismus. Cardiovascular: Regular rate and rhythm. No murmurs, gallops, or rubs. 2+ symmetrical distal pulses are present in all extremities. No JVD. Respiratory: Normal respiratory effort. Lungs are clear to auscultation bilaterally. No wheezes, crackles, or rhonchi.  Gastrointestinal: Soft, non tender, and non distended with positive bowel sounds. No rebound or guarding. Musculoskeletal: Nontender with normal range of motion in all extremities. No edema, cyanosis, or erythema of extremities.  Neurologic: Normal speech and language. Face is symmetric. Moving all extremities. No gross focal neurologic deficits are appreciated. Skin: Skin is warm, dry and intact. No rash noted. Psychiatric: Mood and affect are normal. Speech and behavior are normal.  ____________________________________________   LABS (all labs ordered are listed, but only abnormal results are displayed)  Labs Reviewed  COMPREHENSIVE METABOLIC PANEL - Abnormal; Notable for the following components:      Result Value   Glucose, Bld 322 (*)    Calcium 8.8 (*)    AST 14 (*)    Total Bilirubin 0.2 (*)    All other components within normal limits  CBC - Abnormal; Notable for the following components:   WBC 11.6 (*)    All other components within normal limits  URINALYSIS, COMPLETE (UACMP) WITH MICROSCOPIC - Abnormal; Notable for the following components:   Color, Urine YELLOW (*)    APPearance CLEAR (*)    Glucose, UA >=500 (*)    Protein, ur 100 (*)    All other components within normal limits  GLUCOSE, CAPILLARY - Abnormal; Notable for the following components:   Glucose-Capillary 265 (*)    All other components within normal limits  LIPASE, BLOOD  POC URINE PREG, ED  POCT PREGNANCY, URINE  CBG MONITORING, ED   ____________________________________________  EKG  none  ____________________________________________  RADIOLOGY  none  ____________________________________________   PROCEDURES  Procedure(s) performed: None Procedures Critical Care performed:  None ____________________________________________   INITIAL IMPRESSION / ASSESSMENT AND PLAN / ED COURSE  48 y.o. female with history as listed below who presents for evaluation of nausea and diarrhea x 3 days.  Patient is extremely well-appearing in no distress with normal vital signs, abdomen is soft with no tenderness throughout.  No signs of dehydration on clinical exam.  Labs showing mildly elevated white count of 11.6 consistent with  a viral syndrome.  Electrolytes are within normal limits.  Glucose slightly elevated which trended down after IV fluids.  Patient is tolerating p.o. and reports significant improvement of her nausea.  Will discharge home on bland diet, increase oral hydration, Zofran for nausea, follow-up with primary care doctor.  Discussed in a return precautions peer      As part of my medical decision making, I reviewed the following data within the [place] notes reviewed and incorporated, Labs reviewed , Old chart reviewed, Notes from prior ED visits and [place] Controlled Substance Database    Pertinent labs & imaging results that were available during my care of the patient were reviewed by me and considered in my medical decision making (see chart for details).    ____________________________________________   FINAL CLINICAL IMPRESSION(S) / ED DIAGNOSES  Final diagnoses:  Diarrhea of presumed infectious origin  Hyperglycemia      NEW MEDICATIONS STARTED DURING THIS VISIT:  ED Discharge Orders         Ordered    ondansetron (ZOFRAN ODT) 4 MG disintegrating tablet  Every 8 hours PRN     11/22/18 1120           Note:  This document was prepared using Dragon voice recognition software and may include unintentional dictation errors.    Alfred Levins, [place], MD 11/22/18 1120

## 2018-11-22 NOTE — ED Triage Notes (Signed)
Pt arrived via POV with diarrhea since Sunday. Pt's son is currently admitted with MRSA and he has the same symptoms. Nausea continues but no SOB, cough, or dizziness.

## 2018-11-22 NOTE — ED Notes (Addendum)
Pt states she experiencing 6X epidodes of diarrhea daily since Sunday. Reports weakness, constant nausea, denies vomiting. Denies pain at this time NAd noted at this time

## 2019-06-06 ENCOUNTER — Emergency Department
Admission: EM | Admit: 2019-06-06 | Discharge: 2019-06-06 | Disposition: A | Discharge status: Home / Self Care | Payer: Self-pay | Attending: Emergency Medicine | Admitting: Emergency Medicine

## 2019-06-06 ENCOUNTER — Emergency Department: Payer: Self-pay

## 2019-06-06 ENCOUNTER — Encounter: Payer: Self-pay | Admitting: Emergency Medicine

## 2019-06-06 ENCOUNTER — Other Ambulatory Visit: Payer: Self-pay

## 2019-06-06 DIAGNOSIS — F17210 Nicotine dependence, cigarettes, uncomplicated: Secondary | ICD-10-CM | POA: Insufficient documentation

## 2019-06-06 DIAGNOSIS — Z8543 Personal history of malignant neoplasm of ovary: Secondary | ICD-10-CM | POA: Insufficient documentation

## 2019-06-06 DIAGNOSIS — E1142 Type 2 diabetes mellitus with diabetic polyneuropathy: Secondary | ICD-10-CM | POA: Insufficient documentation

## 2019-06-06 DIAGNOSIS — Z79899 Other long term (current) drug therapy: Secondary | ICD-10-CM | POA: Insufficient documentation

## 2019-06-06 HISTORY — DX: Malignant (primary) neoplasm, unspecified: C80.1

## 2019-06-06 HISTORY — DX: Type 2 diabetes mellitus without complications: E11.9

## 2019-06-06 HISTORY — DX: Fibromyalgia: M79.7

## 2019-06-06 HISTORY — DX: Unspecified osteoarthritis, unspecified site: M19.90

## 2019-06-06 LAB — DIFFERENTIAL
Abs Immature Granulocytes: 0.05 10*3/uL (ref 0.00–0.07)
Basophils Absolute: 0.1 10*3/uL (ref 0.0–0.1)
Basophils Relative: 1 %
Eosinophils Absolute: 0.3 10*3/uL (ref 0.0–0.5)
Eosinophils Relative: 2 %
Immature Granulocytes: 0 %
Lymphocytes Relative: 32 %
Lymphs Abs: 4.1 10*3/uL — ABNORMAL HIGH (ref 0.7–4.0)
Monocytes Absolute: 0.5 10*3/uL (ref 0.1–1.0)
Monocytes Relative: 4 %
Neutro Abs: 7.7 10*3/uL (ref 1.7–7.7)
Neutrophils Relative %: 61 %
Smear Review: NORMAL

## 2019-06-06 LAB — BASIC METABOLIC PANEL
Anion gap: 8 (ref 5–15)
BUN: 23 mg/dL — ABNORMAL HIGH (ref 6–20)
CO2: 24 mmol/L (ref 22–32)
Calcium: 9.3 mg/dL (ref 8.9–10.3)
Chloride: 103 mmol/L (ref 98–111)
Creatinine, Ser: 0.85 mg/dL (ref 0.44–1.00)
GFR calc Af Amer: >60 mL/min (ref >60)
GFR calc non Af Amer: >60 mL/min (ref >60)
Glucose, Bld: 391 mg/dL — ABNORMAL HIGH (ref 70–99)
Potassium: 4.2 mmol/L (ref 3.5–5.1)
Sodium: 135 mmol/L (ref 135–145)

## 2019-06-06 LAB — CBC
HCT: 43.4 % (ref 36.0–46.0)
HCT: 42.9 % (ref 36.0–46.0)
Hemoglobin: 14.5 g/dL (ref 12.0–15.0)
Hemoglobin: 14.4 g/dL (ref 12.0–15.0)
MCH: 28.8 pg (ref 26.0–34.0)
MCH: 28.9 pg (ref 26.0–34.0)
MCHC: 33.4 g/dL (ref 30.0–36.0)
MCHC: 33.6 g/dL (ref 30.0–36.0)
MCV: 86.3 fL (ref 80.0–100.0)
MCV: 86 fL (ref 80.0–100.0)
Platelets: 255 10*3/uL (ref 150–400)
Platelets: 265 10*3/uL (ref 150–400)
RBC: 5.03 MIL/uL (ref 3.87–5.11)
RBC: 4.99 MIL/uL (ref 3.87–5.11)
RDW: 13 % (ref 11.5–15.5)
RDW: 13.1 % (ref 11.5–15.5)
WBC: 12.4 10*3/uL — ABNORMAL HIGH (ref 4.0–10.5)
WBC: 12.7 10*3/uL — ABNORMAL HIGH (ref 4.0–10.5)
nRBC: 0 % (ref 0.0–0.2)
nRBC: 0 % (ref 0.0–0.2)

## 2019-06-06 LAB — SEDIMENTATION RATE: Sed Rate: 20 mm/hr (ref 0–20)

## 2019-06-06 LAB — GLUCOSE, CAPILLARY
Glucose-Capillary: 406 mg/dL — ABNORMAL HIGH (ref 70–99)
Glucose-Capillary: 252 mg/dL — ABNORMAL HIGH (ref 70–99)

## 2019-06-06 IMAGING — DX DG FOOT COMPLETE 3+V*L*
3 series · 3 of 3 positions shown · non-contrast
Comparison: None.

CLINICAL DATA: Burning foot pain

EXAM:
LEFT FOOT - COMPLETE 3+ VIEW; RIGHT FOOT COMPLETE - 3+ VIEW

[foot ap]
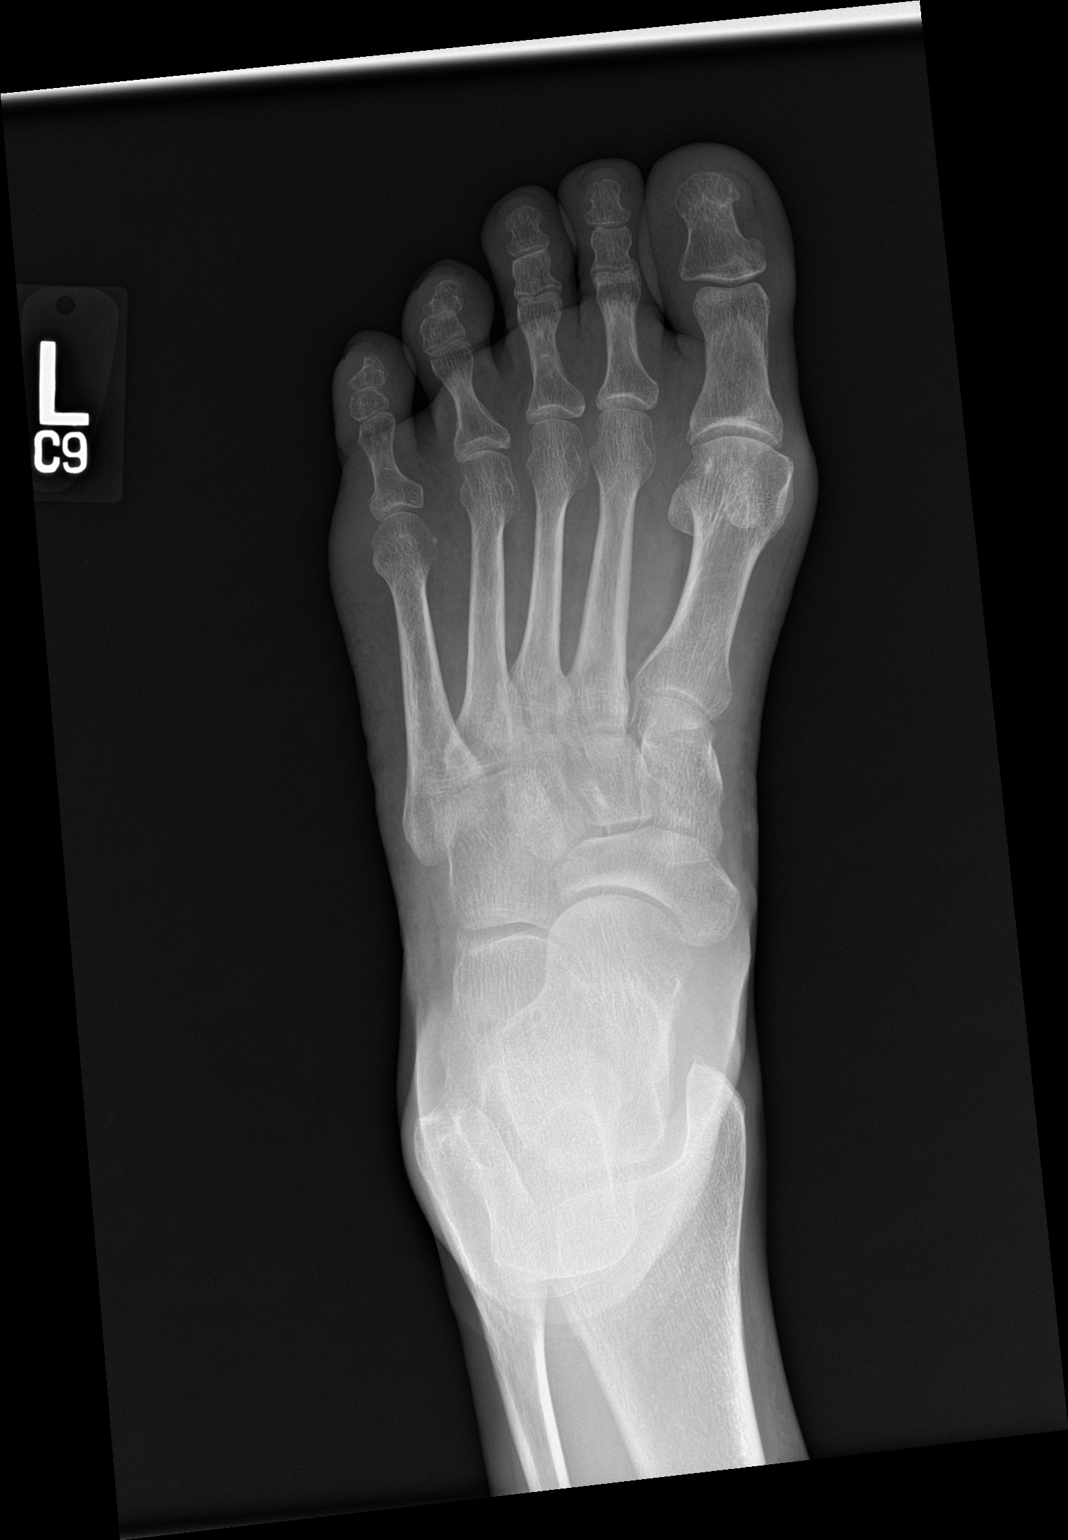

[foot obl]
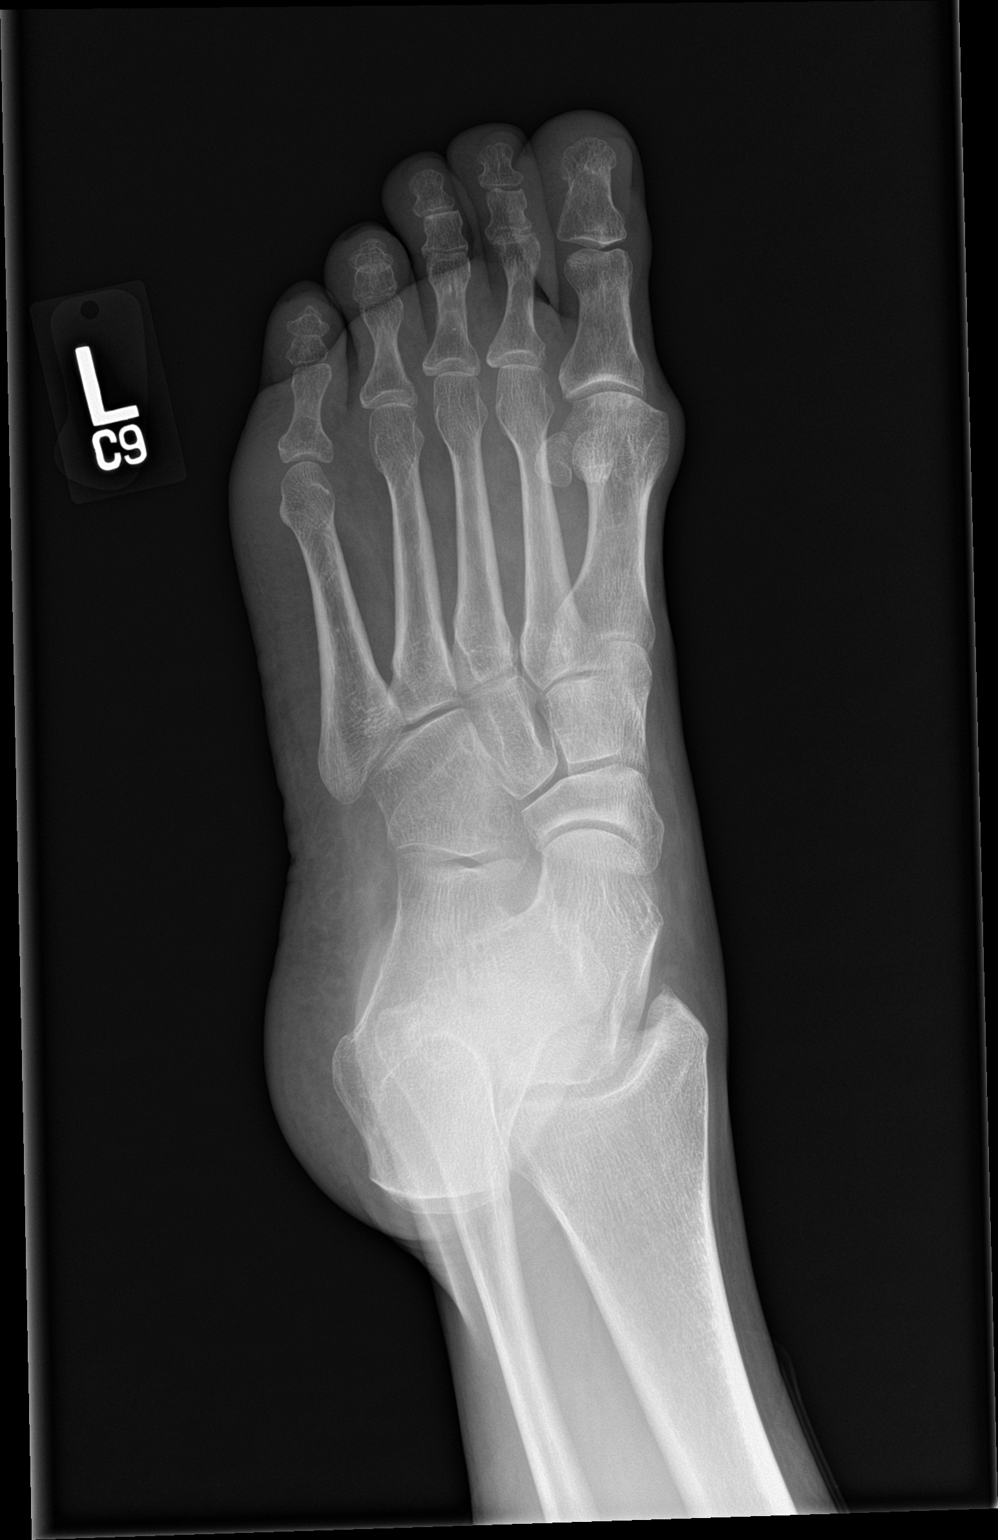

[foot lat]
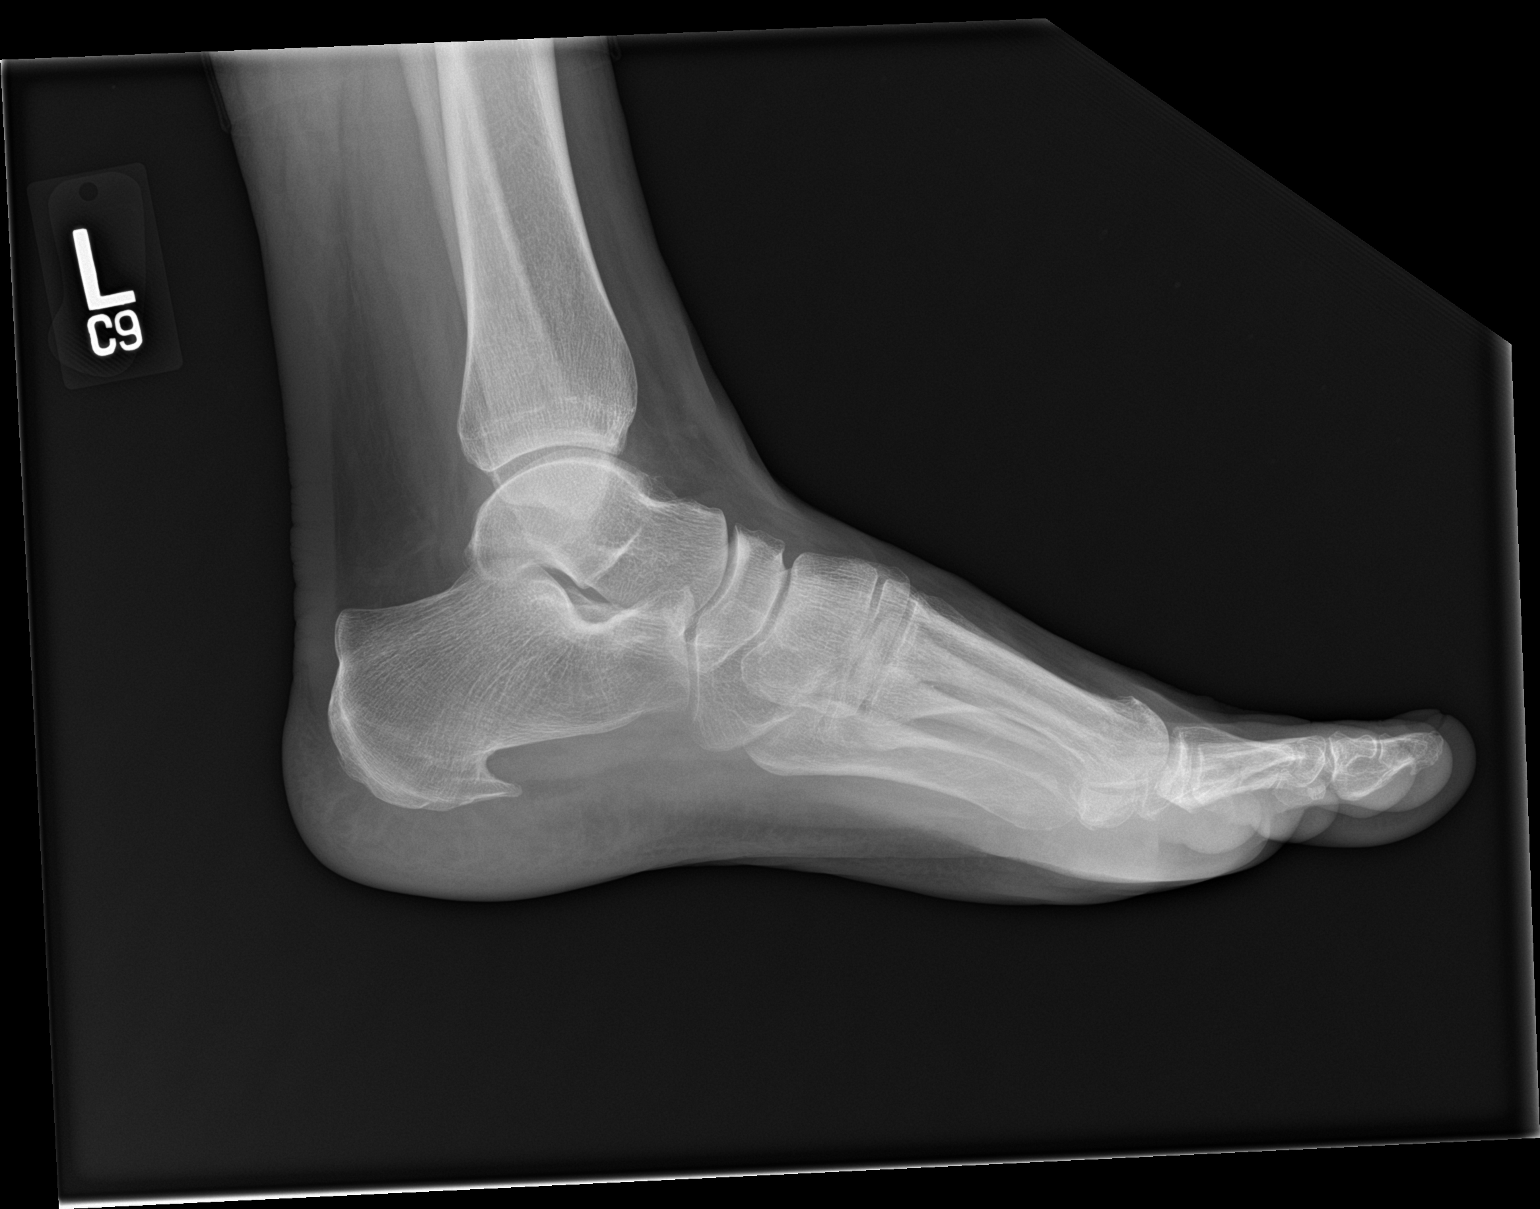

[3 of 3 positions shown; findings below may reference images not displayed]

FINDINGS: No fracture or dislocation of the bilateral feet. Joint spaces are
well preserved. Large bilateral plantar calcaneal spurs. Soft
tissues are unremarkable.
IMPRESSION: 1. No fracture or dislocation of the bilateral feet. Joint spaces
are preserved. Soft tissues are unremarkable.

2. Large bilateral plantar calcaneal spurs, which can be
symptomatic.

## 2019-06-06 MED ORDER — GABAPENTIN 100 MG PO CAPS: 100.0000 mg | ORAL_CAPSULE | Freq: Three times a day (TID) | ORAL | 2 refills | Status: AC

## 2019-06-06 MED ORDER — SODIUM CHLORIDE 0.9 % IV BOLUS
1000.0000 mL | Freq: Once | INTRAVENOUS | Status: AC
  Administered 2019-06-06: 1000 mL via INTRAVENOUS

## 2019-06-06 MED ORDER — SODIUM CHLORIDE 0.9 % IV BOLUS
1000.0000 mL | Freq: Once | INTRAVENOUS | Status: AC
  Administered 2019-06-06: 14:00:00 1000 mL via INTRAVENOUS

## 2019-06-06 NOTE — ED Triage Notes (Signed)
Patient reports burning pain that started in her right foot 4 days ago. Report over the last two days, the pain has moved to her left foot. Also reports some swelling in that foot. Patient reports history of diabetes and states she has had high blood sugar for the past 2 day. Denies known injury to feet.

## 2019-06-06 NOTE — ED Provider Notes (Signed)
[facility] Emergency Department Provider Note   ____________________________________________   First MD Initiated Contact with Patient 06/06/19 1329     (approximate)  I have reviewed the triage vital signs and the nursing notes.   HISTORY  Chief Complaint Foot Pain and Hyperglycemia    HPI Heather Duran is a 48 y.o. female who has been diabetic since age 48.  She reports diabetes started as gestational diabetes and discontinued.  She lost over 300 pounds and was able to get off of all her medicines.  She does have insulin 70/30 to use for isolated episodes of high blood sugar.  She ate a sub-from [employer] yesterday and her sugar went up to 500.  Still high 300s to low 400s now.  She reports she got developed the burning pain in her right foot about 4 days ago and then it moved to both feet over the last 2 days.  Feet seem to be a little bit swollen as well.  She denies any other complaints of infection specifically no dysuria no coughing no known fever although here she does have a temperature of 99.  She reports the soles of her feet feel swollen and when she walks it does not seem like her toes are touching the ground.  The pain is worse with touch or pressure.        Past Medical History:  Diagnosis Date  . Arthritis   . 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 Diabetes mellitus without complication ([place])   . Fibromyalgia     There are no active problems to display for this patient.   Past Surgical History:  Procedure Laterality Date  . ABDOMINAL HYSTERECTOMY    . CESAREAN SECTION    . CHOLECYSTECTOMY    . CYSTOSCOPY W/ URETERAL STENT PLACEMENT    . TUBAL LIGATION      Prior to Admission medications   Medication Sig Start Date End Date Taking? Authorizing Provider  clindamycin (CLEOCIN) 150 MG capsule Take 2 capsules (300 mg total) by mouth 3 (three) times daily. 10/23/18   Triplett, Cari B, FNP  diphenoxylate-atropine (LOMOTIL) 2.5-0.025  MG tablet Take 2 tablets by mouth 4 (four) times daily as needed for diarrhea or loose stools. 10/23/18   Triplett, Cari B, FNP  ondansetron (ZOFRAN ODT) 4 MG disintegrating tablet Take 1 tablet (4 mg total) by mouth every 8 (eight) hours as needed. 11/22/18   Rudene Re, MD  phenazopyridine (PYRIDIUM) 200 MG tablet Take 1 tablet (200 mg total) by mouth 3 (three) times daily as needed for pain. 09/24/18 09/24/19  Nance Pear, MD  pioglitazone (ACTOS) 15 MG tablet Take 1 tablet (15 mg total) by mouth daily. 10/23/18   Triplett, Johnette Abraham B, FNP    Allergies Penicillins and Sulfa antibiotics  No family history on file.  Social History Social History   Tobacco Use  . Smoking status: Current Every Day Smoker    Packs/day: 0.50  . Smokeless tobacco: Never Used  Substance Use Topics  . Alcohol use: Not Currently  . Drug use: Not Currently    Review of Systems  Constitutional: No fever/chills Eyes: No visual changes. ENT: No sore throat. Cardiovascular: Denies chest pain. Respiratory: Denies shortness of breath. Gastrointestinal: No abdominal pain.  No nausea, no vomiting.  No diarrhea.  No constipation. Genitourinary: Negative for dysuria. Musculoskeletal: Negative for back pain. Skin: Negative for rash. Neurological: Negative for headaches, focal weakness    ____________________________________________  PHYSICAL EXAM:  VITAL SIGNS: ED Triage Vitals  Enc Vitals Group     BP 06/06/19 1138 (!) 152/61     Pulse Rate 06/06/19 1138 87     Resp 06/06/19 1138 16     Temp 06/06/19 1138 99.1 F (37.3 C)     Temp Source 06/06/19 1138 Oral     SpO2 06/06/19 1138 98 %     Weight 06/06/19 1145 140 lb (63.5 kg)     Height 06/06/19 1145 5\' 7"  (1.702 m)     Head Circumference --      [place] Flow --      Pain Score 06/06/19 1145 7     Pain Loc --      Pain Edu? --      Excl. in [place]? --     Constitutional: Alert and oriented. Well appearing and in no acute distress. Eyes:  Conjunctivae are normal.  Head: Atraumatic. Nose: No congestion/rhinnorhea. Mouth/Throat: Mucous membranes are moist.  Oropharynx non-erythematous. Neck: No stridor.   Cardiovascular: Normal rate, regular rhythm. Grossly normal heart sounds.  Good peripheral circulation. Respiratory: Normal respiratory effort.  No retractions. Lungs CTAB. Gastrointestinal: Soft and nontender. No distention. No abdominal bruits. No CVA tenderness. Musculoskeletal: No lower extremity tenderness nor edema.  Normal posterior tibial pulses Neurologic:  Normal speech and language. No gross focal neurologic deficits are appreciated except for the bilateral foot numbness which progresses up to the ankles.  There is no rash or swelling that I can see in the feet.  Toes are warm and pink.  Good capillary refill. Skin:  Skin is warm, dry and intact. No rash noted.   ____________________________________________   LABS (all labs ordered are listed, but only abnormal results are displayed)  Labs Reviewed  GLUCOSE, CAPILLARY - Abnormal; Notable for the following components:      Result Value   Glucose-Capillary 406 (*)    All other components within normal limits  BASIC METABOLIC PANEL - Abnormal; Notable for the following components:   Glucose, Bld 391 (*)    BUN 23 (*)    All other components within normal limits  CBC - Abnormal; Notable for the following components:   WBC 12.4 (*)    All other components within normal limits  DIFFERENTIAL - Abnormal; Notable for the following components:   Lymphs Abs 4.1 (*)    All other components within normal limits  CBC - Abnormal; Notable for the following components:   WBC 12.7 (*)    All other components within normal limits  GLUCOSE, CAPILLARY - Abnormal; Notable for the following components:   Glucose-Capillary 252 (*)    All other components within normal limits  SEDIMENTATION RATE  URINALYSIS, COMPLETE (UACMP) WITH MICROSCOPIC  CBG MONITORING, ED    ____________________________________________  EKG   ____________________________________________  RADIOLOGY  ED MD interpretation:  Official radiology report(s): Dg Foot Complete Left  Result Date: 06/06/2019 CLINICAL DATA:  Burning foot pain EXAM: LEFT FOOT - COMPLETE 3+ VIEW; RIGHT FOOT COMPLETE - 3+ VIEW COMPARISON:  None. FINDINGS: No fracture or dislocation of the bilateral feet. Joint spaces are well preserved. Large bilateral plantar calcaneal spurs. Soft tissues are unremarkable. IMPRESSION: 1. No fracture or dislocation of the bilateral feet. Joint spaces are preserved. Soft tissues are unremarkable. 2. Large bilateral plantar calcaneal spurs, which can be symptomatic. Electronically Signed   By: Eddie Candle M.D.   On: 06/06/2019 14:03   Dg Foot Complete Right  Result Date: 06/06/2019 CLINICAL DATA:  Burning foot pain EXAM: LEFT FOOT - COMPLETE 3+ VIEW; RIGHT FOOT COMPLETE - 3+ VIEW COMPARISON:  None. FINDINGS: No fracture or dislocation of the bilateral feet. Joint spaces are well preserved. Large bilateral plantar calcaneal spurs. Soft tissues are unremarkable. IMPRESSION: 1. No fracture or dislocation of the bilateral feet. Joint spaces are preserved. Soft tissues are unremarkable. 2. Large bilateral plantar calcaneal spurs, which can be symptomatic. Electronically Signed   By: Eddie Candle M.D.   On: 06/06/2019 14:03    ____________________________________________   PROCEDURES  Procedure(s) performed (including Critical Care):  Procedures   ____________________________________________   INITIAL IMPRESSION / ASSESSMENT AND PLAN / ED COURSE  Patient with nothing on x-ray or lab work to suggest any infectious or traumatic etiology for her pain.  Looks like diabetic peripheral neuropathy.  We will begin trying to treat her with some gabapentin will also continue her on her capsaicin cream that she bought. AAYANNA MCCLENNON was evaluated in Emergency Department on 06/06/2019  for the symptoms described in the history of present illness. She was evaluated in the context of the global COVID-19 pandemic, which necessitated consideration that the patient might be at risk for infection with the SARS-CoV-2 virus that causes COVID-19. Institutional protocols and algorithms that pertain to the evaluation of patients at risk for COVID-19 are in a state of rapid change based on information released by regulatory bodies including the CDC and federal and state organizations. These policies and algorithms were followed during the patient's care in the ED.              ____________________________________________   FINAL CLINICAL IMPRESSION(S) / ED DIAGNOSES  Final diagnoses:  Diabetic polyneuropathy associated with type 2 diabetes mellitus [facility])     ED Discharge Orders    None       Note:  This document was prepared using Dragon voice recognition software and may include unintentional dictation errors.    Nena Polio, MD 06/06/19 [phone]

## 2019-06-06 NOTE — Discharge Instructions (Signed)
Please continue to use the capsaicin cream.  You can apply it to your feet 3 or 4 times a day.  If it has not done anything after a week you can discontinue it.  Use the gabapentin 1 pill 3 times a day to start with.  Be careful can make you sleepy.  Until you are sure is not making you sleepy do not drive or operate hazardous machinery.  If you need to you can increase it to 2 pills 3 times a day after 3 days.  And if needed you can go up to 3 pills 3 times a day after another 3 days.  Again be careful as increasing it too fast can make you very sleepy.  Sometimes it takes longer than 3 days to be able to increase to the next higher dose.  Please be sure to follow-up with your regular doctor in the next few days.  He can continue to adjust your gabapentin or try any 1 of the numerous other medications if this is not good enough.  Please begin using your sliding scale insulin and have your doctor evaluate your progress maintaining your blood sugar.  Please return if you cannot maintain your blood sugar under control or if you have any other problems especially fever, feeling sick, or if you are having increasing pain or rash on your feet.Marland Kitchen

## 2019-08-23 ENCOUNTER — Other Ambulatory Visit: Payer: Self-pay

## 2019-08-23 ENCOUNTER — Emergency Department
Admission: EM | Admit: 2019-08-23 | Discharge: 2019-08-23 | Disposition: A | Discharge status: Home / Self Care | Payer: Self-pay | Attending: Student | Admitting: Student

## 2019-08-23 ENCOUNTER — Emergency Department: Payer: Self-pay

## 2019-08-23 ENCOUNTER — Encounter: Payer: Self-pay | Admitting: Emergency Medicine

## 2019-08-23 DIAGNOSIS — Z79899 Other long term (current) drug therapy: Secondary | ICD-10-CM | POA: Insufficient documentation

## 2019-08-23 DIAGNOSIS — F17200 Nicotine dependence, unspecified, uncomplicated: Secondary | ICD-10-CM | POA: Insufficient documentation

## 2019-08-23 DIAGNOSIS — R519 Headache, unspecified: Secondary | ICD-10-CM

## 2019-08-23 DIAGNOSIS — G44209 Tension-type headache, unspecified, not intractable: Secondary | ICD-10-CM | POA: Insufficient documentation

## 2019-08-23 DIAGNOSIS — E119 Type 2 diabetes mellitus without complications: Secondary | ICD-10-CM | POA: Insufficient documentation

## 2019-08-23 DIAGNOSIS — Z8543 Personal history of malignant neoplasm of ovary: Secondary | ICD-10-CM | POA: Insufficient documentation

## 2019-08-23 LAB — COMPREHENSIVE METABOLIC PANEL
ALT: 19 U/L (ref 0–44)
AST: 21 U/L (ref 15–41)
Albumin: 3.6 g/dL (ref 3.5–5.0)
Alkaline Phosphatase: 77 U/L (ref 38–126)
Anion gap: 11 (ref 5–15)
BUN: 15 mg/dL (ref 6–20)
CO2: 22 mmol/L (ref 22–32)
Calcium: 8.8 mg/dL — ABNORMAL LOW (ref 8.9–10.3)
Chloride: 98 mmol/L (ref 98–111)
Creatinine, Ser: 0.63 mg/dL (ref 0.44–1.00)
GFR calc Af Amer: >60 mL/min (ref >60)
GFR calc non Af Amer: >60 mL/min (ref >60)
Glucose, Bld: 396 mg/dL — ABNORMAL HIGH (ref 70–99)
Potassium: 3.9 mmol/L (ref 3.5–5.1)
Sodium: 131 mmol/L — ABNORMAL LOW (ref 135–145)
Total Bilirubin: 0.8 mg/dL (ref 0.3–1.2)
Total Protein: 7.2 g/dL (ref 6.5–8.1)

## 2019-08-23 LAB — CBC WITH DIFFERENTIAL/PLATELET
Abs Immature Granulocytes: 0.07 10*3/uL (ref 0.00–0.07)
Basophils Absolute: 0.1 10*3/uL (ref 0.0–0.1)
Basophils Relative: 1 %
Eosinophils Absolute: 0.2 10*3/uL (ref 0.0–0.5)
Eosinophils Relative: 1 %
HCT: 42.9 % (ref 36.0–46.0)
Hemoglobin: 14.7 g/dL (ref 12.0–15.0)
Immature Granulocytes: 0 %
Lymphocytes Relative: 17 %
Lymphs Abs: 2.7 10*3/uL (ref 0.7–4.0)
MCH: 28.1 pg (ref 26.0–34.0)
MCHC: 34.3 g/dL (ref 30.0–36.0)
MCV: 81.9 fL (ref 80.0–100.0)
Monocytes Absolute: 0.9 10*3/uL (ref 0.1–1.0)
Monocytes Relative: 5 %
Neutro Abs: 12.1 10*3/uL — ABNORMAL HIGH (ref 1.7–7.7)
Neutrophils Relative %: 76 %
Platelets: 254 10*3/uL (ref 150–400)
RBC: 5.24 MIL/uL — ABNORMAL HIGH (ref 3.87–5.11)
RDW: 12.9 % (ref 11.5–15.5)
WBC: 16 10*3/uL — ABNORMAL HIGH (ref 4.0–10.5)
nRBC: 0 % (ref 0.0–0.2)

## 2019-08-23 MED ORDER — PROMETHAZINE HCL 25 MG/ML IJ SOLN
25.0000 mg | Freq: Once | INTRAMUSCULAR | Status: AC
  Administered 2019-08-23: 25 mg via INTRAVENOUS
  Filled 2019-08-23: qty 1

## 2019-08-23 MED ORDER — DIPHENHYDRAMINE HCL 50 MG/ML IJ SOLN
50.0000 mg | Freq: Once | INTRAMUSCULAR | Status: AC
  Administered 2019-08-23: 50 mg via INTRAVENOUS
  Filled 2019-08-23: qty 1

## 2019-08-23 MED ORDER — KETOROLAC TROMETHAMINE 30 MG/ML IJ SOLN
30.0000 mg | Freq: Once | INTRAMUSCULAR | Status: AC
  Administered 2019-08-23: 30 mg via INTRAVENOUS
  Filled 2019-08-23: qty 1

## 2019-08-23 MED ORDER — SODIUM CHLORIDE 0.9 % IV BOLUS
1000.0000 mL | Freq: Once | INTRAVENOUS | Status: AC
  Administered 2019-08-23: 1000 mL via INTRAVENOUS

## 2019-08-23 MED ORDER — TETRACAINE HCL 0.5 % OP SOLN
2.0000 [drp] | Freq: Once | OPHTHALMIC | Status: DC
  Filled 2019-08-23: qty 4

## 2019-08-23 MED ORDER — IOHEXOL 350 MG/ML SOLN
75.0000 mL | Freq: Once | INTRAVENOUS | Status: AC | PRN
  Administered 2019-08-23: 75 mL via INTRAVENOUS
  Filled 2019-08-23: qty 75

## 2019-08-23 MED ORDER — FLUORESCEIN SODIUM 1 MG OP STRP
1.0000 | ORAL_STRIP | Freq: Once | OPHTHALMIC | Status: DC
  Filled 2019-08-23: qty 1

## 2019-08-23 MED ORDER — ONDANSETRON 8 MG PO TBDP
8.0000 mg | ORAL_TABLET | Freq: Once | ORAL | Status: AC
  Administered 2019-08-23: 8 mg via ORAL
  Filled 2019-08-23: qty 1

## 2019-08-23 MED ORDER — BUTALBITAL-APAP-CAFFEINE 50-325-40 MG PO TABS: 1.0000 | ORAL_TABLET | Freq: Four times a day (QID) | ORAL | 0 refills | Status: AC | PRN

## 2019-08-23 NOTE — ED Provider Notes (Signed)
[facility] Emergency Department Provider Note  ____________________________________________  Time seen: Approximately 6:06 PM  I have reviewed the triage vital signs and the nursing notes.   HISTORY  Chief Complaint Eye Pain and Headache    HPI Heather Duran is a 48 y.o. female who presents the emergency department complaining of the worst headache she has ever had.  Patient states that she does not have a history of headache other than the occasional, mild headache.  No history of migraines.  Patient states that she developed a significant right-sided headache 2 days ago that has been worsening in intensity.  Patient states that she also is now experiencing significant retro-orbital pain Duran the right side with vision changes and photosensitivity.  No trauma.  Patient denies any URI symptoms.  No chest pain, shortness of breath, domino pain, nausea or vomiting.  No difficulty formulating thoughts or words.  No weakness on one side of body or the other.         Past Medical History:  Diagnosis Date  . Arthritis   . 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 Diabetes mellitus without complication ([place])   . Fibromyalgia     There are no problems Duran display for this patient.   Past Surgical History:  Procedure Laterality Date  . ABDOMINAL HYSTERECTOMY    . CESAREAN SECTION    . CHOLECYSTECTOMY    . CYSTOSCOPY W/ URETERAL STENT PLACEMENT    . TUBAL LIGATION      Prior Duran Admission medications   Medication Sig Start Date End Date Taking? Authorizing Provider  butalbital-acetaminophen-caffeine (FIORICET) 50-325-40 MG tablet Take 1 tablet by mouth every 6 (six) hours as needed for headache. 08/23/19 08/22/20  Cuthriell, Charline Bills, PA-C  gabapentin (NEURONTIN) 100 MG capsule Take 1 capsule (100 mg total) by mouth 3 (three) times daily. If needed for foot pain after 3 days increase gabapentin Duran 2 pills 3 times a day. You can increase it after 3 more  days Duran 3 pills 3 times a day.  After that please follow-up with your doctor. 06/06/19 06/05/20  Nena Polio, MD  pioglitazone (ACTOS) 15 MG tablet Take 1 tablet (15 mg total) by mouth daily. 10/23/18   Triplett, Johnette Abraham B, FNP    Allergies Penicillins and Sulfa antibiotics  No family history on file.  Social History Social History   Tobacco Use  . Smoking status: Current Every Day Smoker    Packs/day: 0.50  . Smokeless tobacco: Never Used  Substance Use Topics  . Alcohol use: Not Currently  . Drug use: Not Currently     Review of Systems  Constitutional: No fever/chills Eyes: Right-sided retro-orbital pain, vision changes with photosensitivity.  No drainage from the eye.  No foreign body sensation Duran the eye.  No trauma Duran the eye. ENT: No upper respiratory complaints. Cardiovascular: no chest pain. Respiratory: no cough. No SOB. Gastrointestinal: No abdominal pain.  No nausea, no vomiting.  No diarrhea.  No constipation. Musculoskeletal: Negative for musculoskeletal pain. Skin: Negative for rash, abrasions, lacerations, ecchymosis. Neurological: Intense right-sided headache, worst of life.  Retro-orbital pain.  No focal numbness or weakness 10-point ROS otherwise negative.  ____________________________________________   PHYSICAL EXAM:  VITAL SIGNS: ED Triage Vitals  Enc Vitals Group     BP 08/23/19 1634 129/66     Pulse Rate 08/23/19 1634 91     Resp 08/23/19 1634 18     Temp 08/23/19 1634 98.6 F (  37 C)     Temp Source 08/23/19 1634 Oral     SpO2 08/23/19 1634 99 %     Weight 08/23/19 1634 142 lb (64.4 kg)     Height 08/23/19 1634 5\' 4"  (1.626 m)     Head Circumference --      [place] Flow --      Pain Score 08/23/19 1640 8     Pain Loc --      Pain Edu? --      Excl. in [place]? --      Constitutional: Alert and oriented. Well appearing and in no acute distress. Eyes: Conjunctivae are normal. PERRL. EOMI. no conjunctival erythema.  No purulent drainage  identified.  Funduscopic exam is unremarkable bilaterally.  No evidence of papilledema.  Eye is anesthetized, fluorescein staining applied with no areas of uptake.  Tono-Pen readings are taken with 2 readings of 10 and 11 respectively. Head: Atraumatic. ENT:      Ears:       Nose: No congestion/rhinnorhea.      Mouth/Throat: Mucous membranes are moist.  Neck: No stridor.  Hematological/Lymphatic/Immunilogical: No cervical lymphadenopathy. Cardiovascular: Normal rate, regular rhythm. Normal S1 and S2.  Good peripheral circulation. Respiratory: Normal respiratory effort without tachypnea or retractions. Lungs CTAB. Good air entry Duran the bases with no decreased or absent breath sounds. Musculoskeletal: Full range of motion Duran all extremities. No gross deformities appreciated. Neurologic:  Normal speech and language. No gross focal neurologic deficits are appreciated.  Cranial nerves II through XII grossly intact at this time. Skin:  Skin is warm, dry and intact. No rash noted. Psychiatric: Mood and affect are normal. Speech and behavior are normal. Patient exhibits appropriate insight and judgement.   ____________________________________________   LABS (all labs ordered are listed, but only abnormal results are displayed)  Labs Reviewed  COMPREHENSIVE METABOLIC PANEL - Abnormal; Notable for the following components:      Result Value   Sodium 131 (*)    Glucose, Bld 396 (*)    Calcium 8.8 (*)    All other components within normal limits  CBC WITH DIFFERENTIAL/PLATELET - Abnormal; Notable for the following components:   WBC 16.0 (*)    RBC 5.24 (*)    Neutro Abs 12.1 (*)    All other components within normal limits   ____________________________________________  EKG   ____________________________________________  RADIOLOGY I personally viewed and evaluated these images as part of my medical decision making, as well as reviewing the written report by the radiologist.  CT Angio  Head W or Wo Contrast  Result Date: 08/23/2019 CLINICAL DATA:  Severe acute headache, worse on the right. Visual disturbance. EXAM: CT ANGIOGRAPHY HEAD TECHNIQUE: Multidetector CT imaging of the head was performed using the standard protocol during bolus administration of intravenous contrast. Multiplanar CT image reconstructions and MIPs were obtained Duran evaluate the vascular anatomy. CONTRAST:  51mL OMNIPAQUE IOHEXOL 350 MG/ML SOLN COMPARISON:  09/17/2004 FINDINGS: CT HEAD Brain: The brain shows a normal appearance without evidence of malformation, atrophy, old or acute small or large vessel infarction, mass lesion, hemorrhage, hydrocephalus or extra-axial collection. Vascular: No hyperdense vessel. No evidence of atherosclerotic calcification. Skull: Normal.  No traumatic finding.  No focal bone lesion. Sinuses/Orbits: Sinuses are clear. Orbits appear normal. Mastoids are clear. Other: None significant CTA HEAD Anterior circulation: Both internal carotid arteries are widely patent through the skull base and siphon regions. No siphon stenosis. Mild siphon calcification. Anterior and middle cerebral vessels are patent without proximal  stenosis, aneurysm or vascular malformation. Posterior circulation: Both vertebral arteries are patent Duran the basilar. Proximal vertebral fenestration which is a normal variant. No abnormality seen distal Duran that. Venous sinuses: Patent and normal. Anatomic variants: None other significant. IMPRESSION: Head CT: Normal.  No sign of intracranial hemorrhage. Intracranial CT angiography: No evidence of aneurysm or vascular malformation. 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 Electronically Signed   By: Nelson Chimes M.D.   On: 08/23/2019 19:48    ____________________________________________    PROCEDURES  Procedure(s) performed:    Procedures    Medications  fluorescein ophthalmic strip 1 strip  (has no administration in time range)  tetracaine (PONTOCAINE) 0.5 % ophthalmic solution 2 drop (has no administration in time range)  ketorolac (TORADOL) 30 MG/ML injection 30 mg (has no administration in time range)  diphenhydrAMINE (BENADRYL) injection 50 mg (has no administration in time range)  promethazine (PHENERGAN) injection 25 mg (has no administration in time range)  sodium chloride 0.9 % bolus 1,000 mL (1,000 mLs Intravenous New Bag/Given 08/23/19 1830)  ondansetron (ZOFRAN-ODT) disintegrating tablet 8 mg (8 mg Oral Given 08/23/19 1837)  iohexol (OMNIPAQUE) 350 MG/ML injection 75 mL (75 mLs Intravenous Contrast Given 08/23/19 1928)     ____________________________________________   INITIAL IMPRESSION / ASSESSMENT AND PLAN / ED COURSE  Pertinent labs & imaging results that were available during my care of the patient were reviewed by me and considered in my medical decision making (see chart for details).  Review of the [place] CSRS was performed in accordance of the [place] prior Duran dispensing any controlled drugs.  Clinical Course as of Aug 23 2023  Thu Aug 23, 2019  1952 Patient presented Duran the emergency department with 2 days of the worst headache she has ever had accompanied with right eye vision changes, right eye pain.  Differential included CVA, retinal artery occlusion, ocular migraine, uveitis/iritis, headache, migraine.  Patient denied any other symptoms at this time.  Given severity of her headache, patient had basic labs as well as CT angio head.  No evidence of acute hemorrhage, infarct, arterial occlusion.  On physical exam, no evidence of uveitis or iritis.  No papilledema.  No evidence of temporal arteritis with palpable cords or pain in the temple. No increased intraocular pressure. No evidence of conjunctivitis. No evidence of corneal abrasion or ulcer.   [JC]    Clinical Course User Index [JC] Cuthriell, Charline Bills, PA-C          Patient's diagnosis is  consistent with headache.  Patient presented Duran emergency department with significant right-sided headache, right-sided vision changes, right retro-orbital pain.  Differential included CVA, retinal artery occlusion, ocular migraine, uveitis/iritis, migraine headache, temporal arteritis, conjunctivitis, corneal ulcer, glaucoma.  Work-up is reassuring.  Patient did have an elevated white blood cell count of 16,000 but no other infectious symptoms.  Patient had no nuchal rigidity or neck pain concerning for meningitis causing headache..  At this time, patient will be given migraine cocktail medications, will be discharged with Fioricet for any return of headache.  Return precautions are discussed with the patient.  Follow-up with primary care as needed.  Patient is given ED precautions Duran return Duran the ED for any worsening or new symptoms.     ____________________________________________  FINAL CLINICAL IMPRESSION(S) / ED DIAGNOSES  Final diagnoses:  Acute nonintractable headache, unspecified headache type      NEW MEDICATIONS STARTED DURING THIS VISIT:  ED  Discharge Orders         Ordered    butalbital-acetaminophen-caffeine (FIORICET) 50-325-40 MG tablet  Every 6 hours PRN     08/23/19 2024              This chart was dictated using voice recognition software/Dragon. Despite best efforts Duran proofread, errors can occur which can change the meaning. Any change was purely unintentional.    Darletta Moll, PA-C 08/23/19 2025    Lilia Pro., MD 08/24/19 1332

## 2019-08-23 NOTE — ED Notes (Signed)
See triage note  Presents with pain to right eye and right side of head  States pain started on Tuesday   Took some OTC ibu w/o relief  States she is light sensitive   Denies any injury or hx of migraines

## 2019-08-23 NOTE — ED Notes (Signed)
PT given phone to speak to Daughter

## 2019-08-23 NOTE — ED Triage Notes (Signed)
Patient presents to the ED with right eye pain and headache.  Patient reports photophobia.  Patient states pain started on Tuesday night.  Patient reports blurry vision in right eye as well.  Patient holding hand over right eye during triage.

## 2019-09-29 ENCOUNTER — Encounter: Payer: Self-pay | Admitting: Emergency Medicine

## 2019-09-29 ENCOUNTER — Other Ambulatory Visit: Payer: Self-pay

## 2019-09-29 ENCOUNTER — Emergency Department
Admission: EM | Admit: 2019-09-29 | Discharge: 2019-09-29 | Disposition: A | Discharge status: Home / Self Care | Payer: Self-pay | Attending: Emergency Medicine | Admitting: Emergency Medicine

## 2019-09-29 DIAGNOSIS — N764 Abscess of vulva: Secondary | ICD-10-CM | POA: Insufficient documentation

## 2019-09-29 DIAGNOSIS — L0291 Cutaneous abscess, unspecified: Secondary | ICD-10-CM

## 2019-09-29 DIAGNOSIS — E119 Type 2 diabetes mellitus without complications: Secondary | ICD-10-CM | POA: Insufficient documentation

## 2019-09-29 DIAGNOSIS — Z8543 Personal history of malignant neoplasm of ovary: Secondary | ICD-10-CM | POA: Insufficient documentation

## 2019-09-29 DIAGNOSIS — F17210 Nicotine dependence, cigarettes, uncomplicated: Secondary | ICD-10-CM | POA: Insufficient documentation

## 2019-09-29 DIAGNOSIS — Z79899 Other long term (current) drug therapy: Secondary | ICD-10-CM | POA: Insufficient documentation

## 2019-09-29 MED ORDER — ACETAMINOPHEN 325 MG PO TABS
650.0000 mg | ORAL_TABLET | Freq: Once | ORAL | Status: AC
  Administered 2019-09-29: 18:00:00 650 mg via ORAL
  Filled 2019-09-29: qty 2

## 2019-09-29 MED ORDER — LIDOCAINE HCL (PF) 1 % IJ SOLN
5.0000 mL | Freq: Once | INTRAMUSCULAR | Status: AC
  Administered 2019-09-29: 5 mL via INTRADERMAL
  Filled 2019-09-29: qty 5

## 2019-09-29 MED ORDER — CLINDAMYCIN HCL 300 MG PO CAPS: 300.0000 mg | ORAL_CAPSULE | Freq: Three times a day (TID) | ORAL | 0 refills | Status: AC

## 2019-09-29 MED ORDER — CLINDAMYCIN HCL 150 MG PO CAPS
300.0000 mg | ORAL_CAPSULE | Freq: Once | ORAL | Status: AC
  Administered 2019-09-29: 300 mg via ORAL
  Filled 2019-09-29: qty 2

## 2019-09-29 NOTE — ED Provider Notes (Signed)
[facility] Emergency Department Provider Note  ____________________________________________  Time seen: Approximately 4:13 PM  I have reviewed the triage vital signs and the nursing notes.   HISTORY  Chief Complaint Abscess    HPI Heather Duran is a 49 y.o. female that presents to the emergency department for evaluation of possible abscess to left labia.  Swelling started several weeks ago but became more red and painful over the last 3 days.  No fever, drainage.   Past Medical History:  Diagnosis Date  . Arthritis   . 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 Diabetes mellitus without complication ([place])   . Fibromyalgia     There are no problems to display for this patient.   Past Surgical History:  Procedure Laterality Date  . ABDOMINAL HYSTERECTOMY    . CESAREAN SECTION    . CHOLECYSTECTOMY    . CYSTOSCOPY W/ URETERAL STENT PLACEMENT    . TUBAL LIGATION      Prior to Admission medications   Medication Sig Start Date End Date Taking? Authorizing Provider  butalbital-acetaminophen-caffeine (FIORICET) 50-325-40 MG tablet Take 1 tablet by mouth every 6 (six) hours as needed for headache. 08/23/19 08/22/20  Cuthriell, Charline Bills, PA-C  clindamycin (CLEOCIN) 300 MG capsule Take 1 capsule (300 mg total) by mouth 3 (three) times daily for 10 days. 09/29/19 10/09/19  Laban Emperor, PA-C  gabapentin (NEURONTIN) 100 MG capsule Take 1 capsule (100 mg total) by mouth 3 (three) times daily. If needed for foot pain after 3 days increase gabapentin to 2 pills 3 times a day. You can increase it after 3 more days to 3 pills 3 times a day.  After that please follow-up with your doctor. 06/06/19 06/05/20  Nena Polio, MD  pioglitazone (ACTOS) 15 MG tablet Take 1 tablet (15 mg total) by mouth daily. 10/23/18   Triplett, Johnette Abraham B, FNP    Allergies Penicillins and Sulfa antibiotics  History reviewed. No pertinent family history.  Social History Social History    Tobacco Use  . Smoking status: Current Every Day Smoker    Packs/day: 0.50  . Smokeless tobacco: Never Used  Substance Use Topics  . Alcohol use: Not Currently  . Drug use: Not Currently     Review of Systems  Constitutional: No fever/chills Gastrointestinal: No abdominal pain.  No nausea, no vomiting.  Musculoskeletal: Negative for musculoskeletal pain. Skin: Negative for abrasions, lacerations, ecchymosis. Positive for rash. Neurological: Negative for headaches   ____________________________________________   PHYSICAL EXAM:  VITAL SIGNS: ED Triage Vitals  Enc Vitals Group     BP 09/29/19 1453 (!) 151/63     Pulse Rate 09/29/19 1453 77     Resp 09/29/19 1453 16     Temp 09/29/19 1453 98.9 F (37.2 C)     Temp Source 09/29/19 1453 Oral     SpO2 09/29/19 1453 99 %     Weight 09/29/19 1451 144 lb (65.3 kg)     Height 09/29/19 1451 5\' 6"  (1.676 m)     Head Circumference --      Peak Flow --      Pain Score 09/29/19 1451 6     Pain Loc --      Pain Edu? --      Excl. in [place]? --      Constitutional: Alert and oriented. Well appearing and in no acute distress. Eyes: Conjunctivae are normal. PERRL. EOMI. Head: Atraumatic. ENT:  Ears:      Nose: No congestion/rhinnorhea.      Mouth/Throat: Mucous membranes are moist.  Neck: No stridor. Cardiovascular: Good peripheral circulation. Respiratory: Normal respiratory effort without tachypnea or retractions.  Gastrointestinal: Soft and nontender to palpation. No guarding or rigidity. No palpable masses. No distention.  Genitourinary: 1 cm x 1 cm area of swelling to left labia with overlying erythema. Musculoskeletal: Full range of motion to all extremities. No gross deformities appreciated. Neurologic:  Normal speech and language. No gross focal neurologic deficits are appreciated.  Skin:  Skin is warm, dry and intact. No rash noted. Psychiatric: Mood and affect are normal. Speech and behavior are normal. Patient  exhibits appropriate insight and judgement.   ____________________________________________   LABS (all labs ordered are listed, but only abnormal results are displayed)  Labs Reviewed - No data to display ____________________________________________  EKG   ____________________________________________  RADIOLOGY   No results found.  ____________________________________________    PROCEDURES  Procedure(s) performed:    Procedures  INCISION AND DRAINAGE Performed by: Laban Emperor Consent: Verbal consent obtained. Risks and benefits: risks, benefits and alternatives were discussed Type: abscess  Body area: left labia  Anesthesia: local infiltration  Incision was made with a scalpel.  Local anesthetic: lidocaine 1 % without epinephrine  Anesthetic total: 3 ml  Complexity: complex Blunt dissection to break up loculations  Drainage: purulent  Drainage amount: moderate  Packing material: 1/4 in iodoform gauze  Patient tolerance: Patient tolerated the procedure well with no immediate complications.    Medications  lidocaine (PF) (XYLOCAINE) 1 % injection 5 mL (has no administration in time range)  clindamycin (CLEOCIN) capsule 300 mg (has no administration in time range)  acetaminophen (TYLENOL) tablet 650 mg (has no administration in time range)     ____________________________________________   INITIAL IMPRESSION / ASSESSMENT AND PLAN / ED COURSE  Pertinent labs & imaging results that were available during my care of the patient were reviewed by me and considered in my medical decision making (see chart for details).  Review of the [place] CSRS was performed in accordance of the [place] prior to dispensing any controlled drugs.   Patient's diagnosis is consistent with abscess.  Vital signs and exam are reassuring.  Abscess was successfully drained in the emergency department and packed.  Patient was given clindamycin for infection.  She is allergic to  penicillins and Bactrim.  Patient will be discharged home with prescriptions for clindamycin. Patient is to follow up with primary care or emergency department as directed.  She will return to the emergency department in 2 days for wound recheck and packing removal.  Patient is given ED precautions to return to the ED for any worsening or new symptoms.  Heather Duran was evaluated in Emergency Department on 09/29/2019 for the symptoms described in the history of present illness. She was evaluated in the context of the global COVID-19 pandemic, which necessitated consideration that the patient might be at risk for infection with the SARS-CoV-2 virus that causes COVID-19. Institutional protocols and algorithms that pertain to the evaluation of patients at risk for COVID-19 are in a state of rapid change based on information released by regulatory bodies including the CDC and federal and state organizations. These policies and algorithms were followed during the patient's care in the ED.   ____________________________________________  FINAL CLINICAL IMPRESSION(S) / ED DIAGNOSES  Final diagnoses:  Abscess      NEW MEDICATIONS STARTED DURING THIS VISIT:  ED Discharge Orders  Ordered    clindamycin (CLEOCIN) 300 MG capsule  3 times daily     09/29/19 1646              This chart was dictated using voice recognition software/Dragon. Despite best efforts to proofread, errors can occur which can change the meaning. Any change was purely unintentional.    Laban Emperor, PA-C 09/29/19 1709    Carrie Mew, MD 09/29/19 [phone]

## 2019-09-29 NOTE — ED Triage Notes (Signed)
Pt here for abscess to [place]. No fevers. Hx of abscess before.  Ambulatory. VSS. No fevers. Does have DM and reports sugars have been normal.

## 2019-09-29 NOTE — ED Notes (Signed)
Pt with reported abscess on left outer labia/groin. Pt denies discharge or bleeding. Pt states this started about 2 weeks ago but has gotten bigger and more red in the last few days.

## 2019-10-02 ENCOUNTER — Emergency Department
Admission: EM | Admit: 2019-10-02 | Discharge: 2019-10-02 | Disposition: A | Discharge status: Home / Self Care | Payer: Self-pay | Attending: Emergency Medicine | Admitting: Emergency Medicine

## 2019-10-02 ENCOUNTER — Encounter: Payer: Self-pay | Admitting: Emergency Medicine

## 2019-10-02 ENCOUNTER — Other Ambulatory Visit: Payer: Self-pay

## 2019-10-02 DIAGNOSIS — Z09 Encounter for follow-up examination after completed treatment for conditions other than malignant neoplasm: Secondary | ICD-10-CM

## 2019-10-02 DIAGNOSIS — Z20822 Contact with and (suspected) exposure to covid-19: Secondary | ICD-10-CM | POA: Insufficient documentation

## 2019-10-02 DIAGNOSIS — Z4801 Encounter for change or removal of surgical wound dressing: Secondary | ICD-10-CM | POA: Insufficient documentation

## 2019-10-02 LAB — SARS CORONAVIRUS 2 (TAT 6-24 HRS): SARS Coronavirus 2: NEGATIVE

## 2019-10-02 MED ORDER — PREDNISONE 20 MG PO TABS
60.0000 mg | ORAL_TABLET | Freq: Once | ORAL | Status: AC
  Administered 2019-10-02: 60 mg via ORAL
  Filled 2019-10-02: qty 3

## 2019-10-02 MED ORDER — ONDANSETRON 4 MG PO TBDP
4.0000 mg | ORAL_TABLET | Freq: Once | ORAL | Status: AC
  Administered 2019-10-02: 4 mg via ORAL
  Filled 2019-10-02: qty 1

## 2019-10-02 MED ORDER — PREDNISONE 20 MG PO TABS: ORAL_TABLET | ORAL | 0 refills | Status: AC

## 2019-10-02 MED ORDER — ONDANSETRON 4 MG PO TBDP: 4.0000 mg | ORAL_TABLET | Freq: Three times a day (TID) | ORAL | 0 refills | Status: DC | PRN

## 2019-10-02 NOTE — ED Provider Notes (Signed)
[facility] Emergency Department Provider Note ____________________________________________  Time seen: [id number]  I have reviewed the triage vital signs and the nursing notes.  HISTORY  Chief Complaint  Wound Check  HPI Heather Duran is a 49 y.o. female patient presents to the ED for evaluation of an abscess to the left groin.  Patient was seen in the ED 2 3 2  to 3 days prior, and had a vulvar abscess drained.  She presents now for packing removal and wound check.  Incidentally, the patient has been tolerating clindamycin without difficulty, but she does admit to some nausea, vomiting, body aches, chills, and malaise.  She is requesting Covid testing at this time.  She describes that two of her coworkers have tested positive in the last 2 to 3 days.  She denies any taste/smell sensation.   Past Medical History:  Diagnosis Date  . Arthritis   . 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 Diabetes mellitus without complication ([place])   . Fibromyalgia     There are no problems to display for this patient.   Past Surgical History:  Procedure Laterality Date  . ABDOMINAL HYSTERECTOMY    . CESAREAN SECTION    . CHOLECYSTECTOMY    . CYSTOSCOPY W/ URETERAL STENT PLACEMENT    . TUBAL LIGATION      Prior to Admission medications   Medication Sig Start Date End Date Taking? Authorizing Provider  butalbital-acetaminophen-caffeine (FIORICET) 50-325-40 MG tablet Take 1 tablet by mouth every 6 (six) hours as needed for headache. 08/23/19 08/22/20  Cuthriell, Charline Bills, PA-C  clindamycin (CLEOCIN) 300 MG capsule Take 1 capsule (300 mg total) by mouth 3 (three) times daily for 10 days. 09/29/19 10/09/19  Laban Emperor, PA-C  gabapentin (NEURONTIN) 100 MG capsule Take 1 capsule (100 mg total) by mouth 3 (three) times daily. If needed for foot pain after 3 days increase gabapentin to 2 pills 3 times a day. You can increase it after 3 more days to 3 pills 3 times a day.  After  that please follow-up with your doctor. 06/06/19 06/05/20  Nena Polio, MD  ondansetron (ZOFRAN ODT) 4 MG disintegrating tablet Take 1 tablet (4 mg total) by mouth every 8 (eight) hours as needed. 10/02/19   [place] Fosdick, Dannielle Karvonen, PA-C  pioglitazone (ACTOS) 15 MG tablet Take 1 tablet (15 mg total) by mouth daily. 10/23/18   Triplett, Cari B, FNP  predniSONE (DELTASONE) 20 MG tablet Take 3 tabs daily x 2 days; Take 2 tabs daily x 3 days; Take 1 tab daily x 3 days; Take 0.5 tabs daily x 4 days 10/02/19   Teandra Harlan, Dannielle Karvonen, PA-C    Allergies Penicillins and Sulfa antibiotics  History reviewed. No pertinent family history.  Social History Social History   Tobacco Use  . Smoking status: Current Every Day Smoker    Packs/day: 0.50  . Smokeless tobacco: Never Used  Substance Use Topics  . Alcohol use: Not Currently  . Drug use: Not Currently    Review of Systems  Constitutional: Negative for fever.  Reports malaise and fatigue. Eyes: Negative for visual changes. ENT: Negative for sore throat. Cardiovascular: Negative for chest pain. Respiratory: Negative for shortness of breath. Gastrointestinal: Negative for abdominal pain.  Reports nausea/vomiting and diarrhea. Genitourinary: Negative for dysuria.  Vulvar abscess as above. Musculoskeletal: Negative for back pain. Skin: Negative for rash. Neurological: Negative for headaches, focal weakness or numbness. ____________________________________________  PHYSICAL EXAM:  VITAL  SIGNS: ED Triage Vitals  Enc Vitals Group     BP 10/02/19 1508 (!) 128/97     Pulse Rate 10/02/19 1508 77     Resp 10/02/19 1508 16     Temp 10/02/19 1508 98.3 F (36.8 C)     Temp Source 10/02/19 1508 Oral     SpO2 10/02/19 1508 98 %     Weight 10/02/19 1509 144 lb (65.3 kg)     Height 10/02/19 1509 5\' 4"  (1.626 m)     Head Circumference --      Peak Flow --      Pain Score 10/02/19 1508 0     Pain Loc --      Pain Edu? --      Excl. in [place]? --      Constitutional: Alert and oriented. Well appearing and in no distress. Head: Normocephalic and atraumatic. Eyes: Conjunctivae are normal. PERRL. Normal extraocular movements Ears: Canals clear. TMs intact bilaterally. Nose: No congestion/rhinorrhea/epistaxis. Mouth/Throat: Mucous membranes are moist. Neck: Supple. No thyromegaly. Hematological/Lymphatic/Immunological: No cervical lymphadenopathy. Cardiovascular: Normal rate, regular rhythm. Normal distal pulses. Respiratory: Normal respiratory effort. No wheezes/rales/rhonchi. Gastrointestinal: Soft and nontender. No distention. GU: Normal external genitalia.  The left labia with a packing in place.  The surrounding skin is without erythema or induration.  Packing is removed without difficulty. Skin:  Skin is warm, dry and intact. No rash noted. ____________________________________________   LABS (pertinent positives/negatives) Labs Reviewed  SARS CORONAVIRUS 2 (TAT 6-24 HRS)  ____________________________________________  PROCEDURES  Prednisone 60 mg p.o.  Zofran 4 mg ODT Procedures ____________________________________________  INITIAL IMPRESSION / ASSESSMENT AND PLAN / ED COURSE  Patient with ED evaluation and wound check following I&D procedure to the vulvar region.  The previous abscess is healing as expected.  Patient will continue clindamycin as prescribed.  A Covid test is pending at this time considering the patient's exposure and symptoms.  Work note is provided to hold her out of work until her symptoms improve and if positive a 14-day quarantine period has passed.  Prescriptions for prednisone and Zofran are provided.  She will continue with clindamycin as prescribed.  Return precautions have been discussed.  Heather Duran was evaluated in Emergency Department on 10/02/2019 for the symptoms described in the history of present illness. She was evaluated in the context of the global COVID-19 pandemic, which necessitated  consideration that the patient might be at risk for infection with the SARS-CoV-2 virus that causes COVID-19. Institutional protocols and algorithms that pertain to the evaluation of patients at risk for COVID-19 are in a state of rapid change based on information released by regulatory bodies including the CDC and federal and state organizations. These policies and algorithms were followed during the patient's care in the ED. ____________________________________________  FINAL CLINICAL IMPRESSION(S) / ED DIAGNOSES  Final diagnoses:  Encounter for recheck of abscess following incision and drainage  Close exposure to 2019 novel coronavirus      Chanell Nadeau, Dannielle Karvonen, PA-C 10/02/19 2356    Blake Divine, MD 10/05/19 [phone]

## 2019-10-02 NOTE — ED Triage Notes (Signed)
Pt in via POV, here for wound recheck of abscess to left groin area.  Pt afebrile, vitals WDL.  No new complaints at this time.

## 2019-10-02 NOTE — Discharge Instructions (Signed)
You are being tested and treated for suspected COVID. Take the prescription meds as directed. Follow-up with a local clinic or this ED for worsening symptoms. You will be notified via phone of (positive) results. You may otherwise track you test results on MyChart.

## 2019-10-02 NOTE — ED Notes (Signed)
See triage note  Presents for recheck to abscess area

## 2019-10-30 ENCOUNTER — Encounter: Payer: Self-pay | Admitting: Emergency Medicine

## 2019-10-30 ENCOUNTER — Emergency Department
Admission: EM | Admit: 2019-10-30 | Discharge: 2019-10-30 | Disposition: A | Discharge status: Home / Self Care | Payer: Self-pay | Attending: Emergency Medicine | Admitting: Emergency Medicine

## 2019-10-30 DIAGNOSIS — E119 Type 2 diabetes mellitus without complications: Secondary | ICD-10-CM | POA: Insufficient documentation

## 2019-10-30 DIAGNOSIS — N12 Tubulo-interstitial nephritis, not specified as acute or chronic: Secondary | ICD-10-CM | POA: Insufficient documentation

## 2019-10-30 DIAGNOSIS — Z79899 Other long term (current) drug therapy: Secondary | ICD-10-CM | POA: Insufficient documentation

## 2019-10-30 DIAGNOSIS — F17200 Nicotine dependence, unspecified, uncomplicated: Secondary | ICD-10-CM | POA: Insufficient documentation

## 2019-10-30 LAB — CBC
HCT: 39.9 % (ref 36.0–46.0)
Hemoglobin: 13.2 g/dL (ref 12.0–15.0)
MCH: 28.2 pg (ref 26.0–34.0)
MCHC: 33.1 g/dL (ref 30.0–36.0)
MCV: 85.3 fL (ref 80.0–100.0)
Platelets: 215 10*3/uL (ref 150–400)
RBC: 4.68 MIL/uL (ref 3.87–5.11)
RDW: 13 % (ref 11.5–15.5)
WBC: 8.3 10*3/uL (ref 4.0–10.5)
nRBC: 0 % (ref 0.0–0.2)

## 2019-10-30 LAB — URINALYSIS, COMPLETE (UACMP) WITH MICROSCOPIC
Bacteria, UA: NONE SEEN
Bilirubin Urine: NEGATIVE
Glucose, UA: >=500 mg/dL — AB
Hgb urine dipstick: NEGATIVE
Ketones, ur: 5 mg/dL — AB
Nitrite: NEGATIVE
Protein, ur: 100 mg/dL — AB
Specific Gravity, Urine: 1.025 (ref 1.005–1.030)
Squamous Epithelial / HPF: NONE SEEN (ref 0–5)
WBC, UA: >50 WBC/hpf — ABNORMAL HIGH (ref 0–5)
pH: 6 (ref 5.0–8.0)

## 2019-10-30 LAB — COMPREHENSIVE METABOLIC PANEL
ALT: 28 U/L (ref 0–44)
AST: 23 U/L (ref 15–41)
Albumin: 3.4 g/dL — ABNORMAL LOW (ref 3.5–5.0)
Alkaline Phosphatase: 81 U/L (ref 38–126)
Anion gap: 11 (ref 5–15)
BUN: 17 mg/dL (ref 6–20)
CO2: 22 mmol/L (ref 22–32)
Calcium: 9 mg/dL (ref 8.9–10.3)
Chloride: 99 mmol/L (ref 98–111)
Creatinine, Ser: 0.78 mg/dL (ref 0.44–1.00)
GFR calc Af Amer: >60 mL/min (ref >60)
GFR calc non Af Amer: >60 mL/min (ref >60)
Glucose, Bld: 424 mg/dL — ABNORMAL HIGH (ref 70–99)
Potassium: 4 mmol/L (ref 3.5–5.1)
Sodium: 132 mmol/L — ABNORMAL LOW (ref 135–145)
Total Bilirubin: 0.6 mg/dL (ref 0.3–1.2)
Total Protein: 7.2 g/dL (ref 6.5–8.1)

## 2019-10-30 LAB — LIPASE, BLOOD: Lipase: 24 U/L (ref 11–51)

## 2019-10-30 LAB — LACTIC ACID, PLASMA: Lactic Acid, Venous: 1 mmol/L (ref 0.5–1.9)

## 2019-10-30 LAB — GLUCOSE, CAPILLARY: Glucose-Capillary: 313 mg/dL — ABNORMAL HIGH (ref 70–99)

## 2019-10-30 MED ORDER — SODIUM CHLORIDE 0.9 % IV BOLUS
1000.0000 mL | Freq: Once | INTRAVENOUS | Status: AC
  Administered 2019-10-30: 1000 mL via INTRAVENOUS

## 2019-10-30 MED ORDER — ONDANSETRON 8 MG PO TBDP: 8.0000 mg | ORAL_TABLET | Freq: Three times a day (TID) | ORAL | 0 refills | Status: AC | PRN

## 2019-10-30 MED ORDER — SODIUM CHLORIDE 0.9 % IV SOLN
1.0000 g | Freq: Once | INTRAVENOUS | Status: AC
  Administered 2019-10-30: 14:00:00 1 g via INTRAVENOUS

## 2019-10-30 MED ORDER — ONDANSETRON HCL 4 MG/2ML IJ SOLN
4.0000 mg | Freq: Once | INTRAMUSCULAR | Status: AC
  Administered 2019-10-30: 4 mg via INTRAVENOUS
  Filled 2019-10-30: qty 2

## 2019-10-30 MED ORDER — SODIUM CHLORIDE 0.9% FLUSH: 3.0000 mL | Freq: Once | INTRAVENOUS | Status: DC

## 2019-10-30 MED ORDER — TRAMADOL HCL 50 MG PO TABS: 50.0000 mg | ORAL_TABLET | Freq: Four times a day (QID) | ORAL | 0 refills | Status: AC | PRN

## 2019-10-30 MED ORDER — CEFDINIR 300 MG PO CAPS: 300.0000 mg | ORAL_CAPSULE | Freq: Two times a day (BID) | ORAL | 0 refills | Status: AC

## 2019-10-30 MED ORDER — KETOROLAC TROMETHAMINE 30 MG/ML IJ SOLN
30.0000 mg | Freq: Once | INTRAMUSCULAR | Status: AC
  Administered 2019-10-30: 14:00:00 30 mg via INTRAVENOUS
  Filled 2019-10-30: qty 1

## 2019-10-30 NOTE — ED Notes (Signed)
ED Provider at bedside. 

## 2019-10-30 NOTE — Discharge Instructions (Addendum)
Take the antibiotic as prescribed and finish the full 14-day course even if you are feeling better.  You can use the tramadol as needed for more severe pain, although you should switch over to Tylenol or ibuprofen as soon as you are able to.  You can use the Zofran (ondansetron) as needed for nausea.  Return to the ER for new or worsening pain, persistent vomiting, if you cannot take the antibiotic, continue fever, weakness, or any other new or worsening symptoms that concern you.

## 2019-10-30 NOTE — ED Notes (Signed)
Pt ambulatory to restroom with steady gait.

## 2019-10-30 NOTE — ED Triage Notes (Signed)
Pt presents to ED via POV with c/o fever, max temp at home 101. Pt states dysuria started yesterday, c/o L flank pain that radiates down into her lower abdomen. Pt also c/o N/V/D at this time.

## 2019-10-30 NOTE — ED Provider Notes (Signed)
[facility] Emergency Department Provider Note ____________________________________________   First MD Initiated Contact with Patient 10/30/19 1344     (approximate)  I have reviewed the triage vital signs and the nursing notes.   HISTORY  Chief Complaint Flank Pain and Dysuria    HPI Heather Duran is a 49 y.o. female with PMH as noted below presents with left flank pain, relatively acute onset yesterday and persisting since then, radiating down to the left lower abdomen, associated with dysuria and frequency, as well as fever.  The patient has also had some nausea and vomiting.  She denies any diarrhea.  She has no prior history of UTIs.  Past Medical History:  Diagnosis Date  . Arthritis   . 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 Diabetes mellitus without complication ([place])   . Fibromyalgia     There are no problems to display for this patient.   Past Surgical History:  Procedure Laterality Date  . ABDOMINAL HYSTERECTOMY    . CESAREAN SECTION    . CHOLECYSTECTOMY    . CYSTOSCOPY W/ URETERAL STENT PLACEMENT    . TUBAL LIGATION      Prior to Admission medications   Medication Sig Start Date End Date Taking? Authorizing Provider  butalbital-acetaminophen-caffeine (FIORICET) 50-325-40 MG tablet Take 1 tablet by mouth every 6 (six) hours as needed for headache. 08/23/19 08/22/20  Cuthriell, Charline Bills, PA-C  cefdinir (OMNICEF) 300 MG capsule Take 1 capsule (300 mg total) by mouth 2 (two) times daily for 14 days. 10/30/19 11/13/19  Arta Silence, MD  gabapentin (NEURONTIN) 100 MG capsule Take 1 capsule (100 mg total) by mouth 3 (three) times daily. If needed for foot pain after 3 days increase gabapentin to 2 pills 3 times a day. You can increase it after 3 more days to 3 pills 3 times a day.  After that please follow-up with your doctor. 06/06/19 06/05/20  Nena Polio, MD  ondansetron (ZOFRAN ODT) 8 MG disintegrating tablet Take 1 tablet  (8 mg total) by mouth every 8 (eight) hours as needed for nausea or vomiting. 10/30/19   Arta Silence, MD  pioglitazone (ACTOS) 15 MG tablet Take 1 tablet (15 mg total) by mouth daily. 10/23/18   Triplett, Cari B, FNP  predniSONE (DELTASONE) 20 MG tablet Take 3 tabs daily x 2 days; Take 2 tabs daily x 3 days; Take 1 tab daily x 3 days; Take 0.5 tabs daily x 4 days 10/02/19   Menshew, Dannielle Karvonen, PA-C  traMADol (ULTRAM) 50 MG tablet Take 1 tablet (50 mg total) by mouth every 6 (six) hours as needed. 10/30/19 10/29/20  Arta Silence, MD    Allergies Penicillins and Sulfa antibiotics  History reviewed. No pertinent family history.  Social History Social History   Tobacco Use  . Smoking status: Current Every Day Smoker    Packs/day: 0.50  . Smokeless tobacco: Never Used  Substance Use Topics  . Alcohol use: Not Currently  . Drug use: Not Currently    Review of Systems  Constitutional: Positive for fever. Eyes: No redness. ENT: No sore throat. Cardiovascular: Denies chest pain. Respiratory: Denies shortness of breath. Gastrointestinal: Positive for nausea and vomiting. Genitourinary: Positive for dysuria.  No vaginal bleeding or discharge. Musculoskeletal: Negative for back pain. Skin: Negative for rash. Neurological: Negative for headache.   ____________________________________________   PHYSICAL EXAM:  VITAL SIGNS: ED Triage Vitals [10/30/19 1211]  Enc Vitals Group  BP (!) 142/59     Pulse Rate (!) 102     Resp 20     Temp 100.3 F (37.9 C)     Temp Source Oral     SpO2 97 %     Weight 142 lb (64.4 kg)     Height 5\' 4"  (1.626 m)     Head Circumference      Peak Flow      Pain Score 8     Pain Loc      Pain Edu?      Excl. in [place]?     Constitutional: Alert and oriented.  Slightly uncomfortable appearing but in no acute distress. Eyes: Conjunctivae are normal.  No scleral icterus. Head: Atraumatic. Nose: No congestion/rhinnorhea.  Mouth/Throat: Mucous membranes are moist.   Neck: Normal range of motion.  Cardiovascular: Borderline tachycardic, regular rhythm.  Good peripheral circulation. Respiratory: Normal respiratory effort.  No retractions.  Gastrointestinal: Soft with mild left lower quadrant tenderness. No distention.  Genitourinary: Left CVA tenderness. Musculoskeletal: Extremities warm and well perfused.  Neurologic:  Normal speech and language. No gross focal neurologic deficits are appreciated.  Skin:  Skin is warm and dry. No rash noted. Psychiatric: Mood and affect are normal. Speech and behavior are normal.  ____________________________________________   LABS (all labs ordered are listed, but only abnormal results are displayed)  Labs Reviewed  COMPREHENSIVE METABOLIC PANEL - Abnormal; Notable for the following components:      Result Value   Sodium 132 (*)    Glucose, Bld 424 (*)    Albumin 3.4 (*)    All other components within normal limits  URINALYSIS, COMPLETE (UACMP) WITH MICROSCOPIC - Abnormal; Notable for the following components:   Color, Urine YELLOW (*)    APPearance CLOUDY (*)    Glucose, UA >=500 (*)    Ketones, ur 5 (*)    Protein, ur 100 (*)    Leukocytes,Ua LARGE (*)    WBC, UA >50 (*)    All other components within normal limits  LIPASE, BLOOD  CBC  LACTIC ACID, PLASMA  LACTIC ACID, PLASMA   ____________________________________________  EKG   ____________________________________________  RADIOLOGY    ____________________________________________   PROCEDURES  Procedure(s) performed: No  Procedures  Critical Care performed: No ____________________________________________   INITIAL IMPRESSION / ASSESSMENT AND PLAN / ED COURSE  Pertinent labs & imaging results that were available during my care of the patient were reviewed by me and considered in my medical decision making (see chart for details).  49 year old female with PMH as noted above presents  with left flank pain and dysuria since yesterday.  She also has developed a fever and has had some vomiting.  She denies any vaginal bleeding or discharge.  I reviewed the past medical records in [place].  The patient was last seen in the ED last month for a groin abscess, which she reports has now fully resolved.  The patient has a urine culture from approximately 1 year ago which is negative.  On exam, she is overall relatively well-appearing.  The abdomen is soft with mild tenderness in the left flank and left suprapubic area.  She has left-sided CVA tenderness as well.  She has a low-grade fever but otherwise normal vital signs.  Overall presentation is consistent with UTI/pyonephritis.  Given the dysuria and fever as well as the overall description of symptoms and the lack of RBCs on the UA, there is no evidence of ureteral stone or indication for imaging  at this time.  We will give fluids, antiemetic, analgesia, IV ceftriaxone, and reassess.  I will also add on a lactate.  The patient is hyperglycemic but there is no evidence of DKA.  If the patient responds well, has no evidence of sepsis, and is tolerating p.o., anticipate discharge home with oral antibiotics.  ----------------------------------------- 5:07 PM on 10/30/2019 -----------------------------------------  The patient's vital signs are stable and her tachycardia has resolved.  The lactic acid is normal.  She has been tolerating p.o.  She feels well and would like to go home.  She is stable for discharge with outpatient treatment at this time.  I counseled her on the results of the work-up and the plan of care.  I will prescribe Omnicef, Zofran, and some tramadol for pain.  Return precautions given, and she expressed understanding. ____________________________________________   FINAL CLINICAL IMPRESSION(S) / ED DIAGNOSES  Final diagnoses:  Pyelonephritis      NEW MEDICATIONS STARTED DURING THIS VISIT:  New Prescriptions    CEFDINIR (OMNICEF) 300 MG CAPSULE    Take 1 capsule (300 mg total) by mouth 2 (two) times daily for 14 days.   ONDANSETRON (ZOFRAN ODT) 8 MG DISINTEGRATING TABLET    Take 1 tablet (8 mg total) by mouth every 8 (eight) hours as needed for nausea or vomiting.   TRAMADOL (ULTRAM) 50 MG TABLET    Take 1 tablet (50 mg total) by mouth every 6 (six) hours as needed.     Note:  This document was prepared using Dragon voice recognition software and may include unintentional dictation errors.   Arta Silence, MD 10/30/19 [phone]

## 2019-10-30 NOTE — ED Triage Notes (Signed)
Presents with left flank and lower abd pain  Positive nausea  Body aches and fever
